# Patient Record
Sex: Female | Born: 1945
Health system: Southern US, Community
[De-identification: ages and names within clinical notes are randomized; demographics above are authoritative.]

## PROBLEM LIST (undated history)

## (undated) DIAGNOSIS — M1990 Unspecified osteoarthritis, unspecified site: Secondary | ICD-10-CM

## (undated) HISTORY — PX: TUBAL LIGATION: SHX77

## (undated) HISTORY — DX: Unspecified osteoarthritis, unspecified site: M19.90

---

## 1998-09-14 ENCOUNTER — Other Ambulatory Visit: Admission: RE | Admit: 1998-09-14 | Discharge: 1998-09-14 | Payer: Self-pay | Admitting: *Deleted

## 1999-01-10 ENCOUNTER — Ambulatory Visit (HOSPITAL_COMMUNITY): Admission: RE | Admit: 1999-01-10 | Discharge: 1999-01-10 | Payer: Self-pay | Admitting: Gastroenterology

## 1999-01-10 ENCOUNTER — Encounter: Payer: Self-pay | Admitting: Gastroenterology

## 1999-09-21 ENCOUNTER — Other Ambulatory Visit: Admission: RE | Admit: 1999-09-21 | Discharge: 1999-09-21 | Payer: Self-pay | Admitting: *Deleted

## 1999-10-11 ENCOUNTER — Encounter: Admission: RE | Admit: 1999-10-11 | Discharge: 1999-10-11 | Payer: Self-pay | Admitting: *Deleted

## 1999-10-11 ENCOUNTER — Encounter: Payer: Self-pay | Admitting: *Deleted

## 2000-10-14 ENCOUNTER — Other Ambulatory Visit: Admission: RE | Admit: 2000-10-14 | Discharge: 2000-10-14 | Payer: Self-pay | Admitting: *Deleted

## 2000-10-15 ENCOUNTER — Encounter: Admission: RE | Admit: 2000-10-15 | Discharge: 2000-10-15 | Payer: Self-pay | Admitting: *Deleted

## 2000-10-15 ENCOUNTER — Encounter: Payer: Self-pay | Admitting: *Deleted

## 2001-10-16 ENCOUNTER — Other Ambulatory Visit: Admission: RE | Admit: 2001-10-16 | Discharge: 2001-10-16 | Payer: Self-pay | Admitting: *Deleted

## 2001-10-22 ENCOUNTER — Encounter: Payer: Self-pay | Admitting: *Deleted

## 2001-10-22 ENCOUNTER — Encounter: Admission: RE | Admit: 2001-10-22 | Discharge: 2001-10-22 | Payer: Self-pay | Admitting: *Deleted

## 2002-11-04 ENCOUNTER — Encounter: Admission: RE | Admit: 2002-11-04 | Discharge: 2002-11-04 | Payer: Self-pay | Admitting: *Deleted

## 2002-11-04 ENCOUNTER — Encounter: Payer: Self-pay | Admitting: *Deleted

## 2002-12-08 ENCOUNTER — Other Ambulatory Visit: Admission: RE | Admit: 2002-12-08 | Discharge: 2002-12-08 | Payer: Self-pay | Admitting: *Deleted

## 2003-12-30 ENCOUNTER — Encounter: Admission: RE | Admit: 2003-12-30 | Discharge: 2003-12-30 | Payer: Self-pay | Admitting: *Deleted

## 2004-01-07 ENCOUNTER — Encounter: Admission: RE | Admit: 2004-01-07 | Discharge: 2004-01-07 | Payer: Self-pay | Admitting: *Deleted

## 2004-02-01 ENCOUNTER — Other Ambulatory Visit: Admission: RE | Admit: 2004-02-01 | Discharge: 2004-02-01 | Payer: Self-pay | Admitting: *Deleted

## 2005-02-27 ENCOUNTER — Encounter: Admission: RE | Admit: 2005-02-27 | Discharge: 2005-02-27 | Payer: Self-pay | Admitting: *Deleted

## 2005-03-19 ENCOUNTER — Other Ambulatory Visit: Admission: RE | Admit: 2005-03-19 | Discharge: 2005-03-19 | Payer: Self-pay | Admitting: Family Medicine

## 2006-05-09 ENCOUNTER — Encounter: Admission: RE | Admit: 2006-05-09 | Discharge: 2006-05-09 | Payer: Self-pay | Admitting: Family Medicine

## 2007-06-17 ENCOUNTER — Encounter: Admission: RE | Admit: 2007-06-17 | Discharge: 2007-06-17 | Payer: Self-pay | Admitting: Family Medicine

## 2007-07-09 ENCOUNTER — Other Ambulatory Visit: Admission: RE | Admit: 2007-07-09 | Discharge: 2007-07-09 | Payer: Self-pay | Admitting: Family Medicine

## 2008-07-13 ENCOUNTER — Encounter: Admission: RE | Admit: 2008-07-13 | Discharge: 2008-07-13 | Payer: Self-pay | Admitting: Family Medicine

## 2009-08-10 ENCOUNTER — Encounter: Admission: RE | Admit: 2009-08-10 | Discharge: 2009-08-10 | Payer: Self-pay | Admitting: Family Medicine

## 2010-01-16 ENCOUNTER — Other Ambulatory Visit: Admission: RE | Admit: 2010-01-16 | Discharge: 2010-01-16 | Payer: Self-pay | Admitting: Family Medicine

## 2010-08-27 ENCOUNTER — Encounter: Payer: Self-pay | Admitting: *Deleted

## 2010-09-05 ENCOUNTER — Encounter
Admission: RE | Admit: 2010-09-05 | Discharge: 2010-09-05 | Payer: Self-pay | Source: Home / Self Care | Attending: Family Medicine | Admitting: Family Medicine

## 2011-06-08 ENCOUNTER — Other Ambulatory Visit: Payer: Self-pay | Admitting: Dermatology

## 2011-09-24 ENCOUNTER — Other Ambulatory Visit: Payer: Self-pay | Admitting: Family Medicine

## 2011-09-24 DIAGNOSIS — Z1231 Encounter for screening mammogram for malignant neoplasm of breast: Secondary | ICD-10-CM

## 2011-10-01 ENCOUNTER — Ambulatory Visit
Admission: RE | Admit: 2011-10-01 | Discharge: 2011-10-01 | Disposition: A | Discharge status: Home / Self Care | Payer: Medicare Other | Source: Ambulatory Visit | Attending: Family Medicine | Admitting: Family Medicine

## 2011-10-01 DIAGNOSIS — Z1231 Encounter for screening mammogram for malignant neoplasm of breast: Secondary | ICD-10-CM

## 2012-04-02 DIAGNOSIS — Z136 Encounter for screening for cardiovascular disorders: Secondary | ICD-10-CM | POA: Diagnosis not present

## 2012-04-02 DIAGNOSIS — Z131 Encounter for screening for diabetes mellitus: Secondary | ICD-10-CM | POA: Diagnosis not present

## 2012-04-02 DIAGNOSIS — Z23 Encounter for immunization: Secondary | ICD-10-CM | POA: Diagnosis not present

## 2012-04-02 DIAGNOSIS — Z0000 Encounter for general adult medical examination without abnormal findings: Secondary | ICD-10-CM | POA: Diagnosis not present

## 2012-04-02 DIAGNOSIS — Z13220 Encounter for screening for lipoid disorders: Secondary | ICD-10-CM | POA: Diagnosis not present

## 2012-04-02 DIAGNOSIS — M899 Disorder of bone, unspecified: Secondary | ICD-10-CM | POA: Diagnosis not present

## 2012-04-10 DIAGNOSIS — H25019 Cortical age-related cataract, unspecified eye: Secondary | ICD-10-CM | POA: Diagnosis not present

## 2012-05-15 DIAGNOSIS — M25569 Pain in unspecified knee: Secondary | ICD-10-CM | POA: Diagnosis not present

## 2012-05-29 DIAGNOSIS — M899 Disorder of bone, unspecified: Secondary | ICD-10-CM | POA: Diagnosis not present

## 2012-05-29 DIAGNOSIS — M949 Disorder of cartilage, unspecified: Secondary | ICD-10-CM | POA: Diagnosis not present

## 2012-07-08 DIAGNOSIS — Z23 Encounter for immunization: Secondary | ICD-10-CM | POA: Diagnosis not present

## 2012-11-13 DIAGNOSIS — M25569 Pain in unspecified knee: Secondary | ICD-10-CM | POA: Diagnosis not present

## 2012-12-15 ENCOUNTER — Other Ambulatory Visit: Payer: Self-pay

## 2012-12-15 DIAGNOSIS — Z1231 Encounter for screening mammogram for malignant neoplasm of breast: Secondary | ICD-10-CM

## 2013-01-20 ENCOUNTER — Ambulatory Visit
Admission: RE | Admit: 2013-01-20 | Discharge: 2013-01-20 | Disposition: A | Discharge status: Home / Self Care | Payer: Medicare Other | Source: Ambulatory Visit

## 2013-01-20 DIAGNOSIS — Z1231 Encounter for screening mammogram for malignant neoplasm of breast: Secondary | ICD-10-CM | POA: Diagnosis not present

## 2013-04-24 DIAGNOSIS — Z8041 Family history of malignant neoplasm of ovary: Secondary | ICD-10-CM | POA: Diagnosis not present

## 2013-04-24 DIAGNOSIS — Z0000 Encounter for general adult medical examination without abnormal findings: Secondary | ICD-10-CM | POA: Diagnosis not present

## 2013-04-24 DIAGNOSIS — Z131 Encounter for screening for diabetes mellitus: Secondary | ICD-10-CM | POA: Diagnosis not present

## 2013-04-24 DIAGNOSIS — M899 Disorder of bone, unspecified: Secondary | ICD-10-CM | POA: Diagnosis not present

## 2013-04-24 DIAGNOSIS — Z803 Family history of malignant neoplasm of breast: Secondary | ICD-10-CM | POA: Diagnosis not present

## 2013-04-24 DIAGNOSIS — M21619 Bunion of unspecified foot: Secondary | ICD-10-CM | POA: Diagnosis not present

## 2013-05-27 DIAGNOSIS — M1710 Unilateral primary osteoarthritis, unspecified knee: Secondary | ICD-10-CM | POA: Diagnosis not present

## 2013-06-04 DIAGNOSIS — Z23 Encounter for immunization: Secondary | ICD-10-CM | POA: Diagnosis not present

## 2013-09-01 ENCOUNTER — Other Ambulatory Visit: Payer: Self-pay | Admitting: Physician Assistant

## 2013-09-01 ENCOUNTER — Ambulatory Visit
Admission: RE | Admit: 2013-09-01 | Discharge: 2013-09-01 | Disposition: A | Discharge status: Home / Self Care | Payer: Medicare Other | Source: Ambulatory Visit | Attending: Physician Assistant | Admitting: Physician Assistant

## 2013-09-01 DIAGNOSIS — R071 Chest pain on breathing: Secondary | ICD-10-CM | POA: Diagnosis not present

## 2013-09-01 DIAGNOSIS — R059 Cough, unspecified: Secondary | ICD-10-CM

## 2013-09-01 DIAGNOSIS — R509 Fever, unspecified: Secondary | ICD-10-CM

## 2013-09-01 DIAGNOSIS — R05 Cough: Secondary | ICD-10-CM

## 2013-09-01 DIAGNOSIS — J069 Acute upper respiratory infection, unspecified: Secondary | ICD-10-CM | POA: Diagnosis not present

## 2013-09-01 DIAGNOSIS — R918 Other nonspecific abnormal finding of lung field: Secondary | ICD-10-CM | POA: Diagnosis not present

## 2013-09-14 ENCOUNTER — Ambulatory Visit
Admission: RE | Admit: 2013-09-14 | Discharge: 2013-09-14 | Disposition: A | Discharge status: Home / Self Care | Payer: Medicare Other | Source: Ambulatory Visit | Attending: Family Medicine | Admitting: Family Medicine

## 2013-09-14 ENCOUNTER — Other Ambulatory Visit: Payer: Self-pay | Admitting: Family Medicine

## 2013-09-14 DIAGNOSIS — R918 Other nonspecific abnormal finding of lung field: Secondary | ICD-10-CM

## 2013-09-14 DIAGNOSIS — R071 Chest pain on breathing: Secondary | ICD-10-CM | POA: Diagnosis not present

## 2013-09-18 DIAGNOSIS — H04129 Dry eye syndrome of unspecified lacrimal gland: Secondary | ICD-10-CM | POA: Diagnosis not present

## 2013-10-08 DIAGNOSIS — Z1211 Encounter for screening for malignant neoplasm of colon: Secondary | ICD-10-CM | POA: Diagnosis not present

## 2013-12-23 ENCOUNTER — Other Ambulatory Visit: Payer: Self-pay

## 2013-12-23 DIAGNOSIS — Z1231 Encounter for screening mammogram for malignant neoplasm of breast: Secondary | ICD-10-CM

## 2014-01-27 ENCOUNTER — Ambulatory Visit
Admission: RE | Admit: 2014-01-27 | Discharge: 2014-01-27 | Disposition: A | Discharge status: Home / Self Care | Payer: Medicare Other | Source: Ambulatory Visit

## 2014-01-27 DIAGNOSIS — Z1231 Encounter for screening mammogram for malignant neoplasm of breast: Secondary | ICD-10-CM

## 2014-04-28 DIAGNOSIS — M949 Disorder of cartilage, unspecified: Secondary | ICD-10-CM | POA: Diagnosis not present

## 2014-04-28 DIAGNOSIS — M899 Disorder of bone, unspecified: Secondary | ICD-10-CM | POA: Diagnosis not present

## 2014-04-28 DIAGNOSIS — Z01419 Encounter for gynecological examination (general) (routine) without abnormal findings: Secondary | ICD-10-CM | POA: Diagnosis not present

## 2014-04-28 DIAGNOSIS — Z8041 Family history of malignant neoplasm of ovary: Secondary | ICD-10-CM | POA: Diagnosis not present

## 2014-04-28 DIAGNOSIS — Z23 Encounter for immunization: Secondary | ICD-10-CM | POA: Diagnosis not present

## 2014-04-28 DIAGNOSIS — Z0000 Encounter for general adult medical examination without abnormal findings: Secondary | ICD-10-CM | POA: Diagnosis not present

## 2014-04-28 DIAGNOSIS — Z131 Encounter for screening for diabetes mellitus: Secondary | ICD-10-CM | POA: Diagnosis not present

## 2014-04-28 DIAGNOSIS — Z803 Family history of malignant neoplasm of breast: Secondary | ICD-10-CM | POA: Diagnosis not present

## 2014-06-01 DIAGNOSIS — M810 Age-related osteoporosis without current pathological fracture: Secondary | ICD-10-CM | POA: Diagnosis not present

## 2014-08-16 ENCOUNTER — Other Ambulatory Visit: Payer: Self-pay | Admitting: Dermatology

## 2014-08-16 DIAGNOSIS — L812 Freckles: Secondary | ICD-10-CM | POA: Diagnosis not present

## 2014-08-16 DIAGNOSIS — L821 Other seborrheic keratosis: Secondary | ICD-10-CM | POA: Diagnosis not present

## 2014-08-16 DIAGNOSIS — L820 Inflamed seborrheic keratosis: Secondary | ICD-10-CM | POA: Diagnosis not present

## 2014-08-16 DIAGNOSIS — C44612 Basal cell carcinoma of skin of right upper limb, including shoulder: Secondary | ICD-10-CM | POA: Diagnosis not present

## 2014-08-16 DIAGNOSIS — L700 Acne vulgaris: Secondary | ICD-10-CM | POA: Diagnosis not present

## 2014-08-16 DIAGNOSIS — D2271 Melanocytic nevi of right lower limb, including hip: Secondary | ICD-10-CM | POA: Diagnosis not present

## 2014-08-16 DIAGNOSIS — D1801 Hemangioma of skin and subcutaneous tissue: Secondary | ICD-10-CM | POA: Diagnosis not present

## 2014-08-16 DIAGNOSIS — D485 Neoplasm of uncertain behavior of skin: Secondary | ICD-10-CM | POA: Diagnosis not present

## 2014-08-18 ENCOUNTER — Encounter: Payer: Self-pay | Admitting: Sports Medicine

## 2014-08-18 ENCOUNTER — Ambulatory Visit (INDEPENDENT_AMBULATORY_CARE_PROVIDER_SITE_OTHER): Payer: Medicare Other | Admitting: Sports Medicine

## 2014-08-18 VITALS — BP 111/74 | HR 75 | Ht 62.0 in | Wt 110.0 lb

## 2014-08-18 DIAGNOSIS — M2010 Hallux valgus (acquired), unspecified foot: Secondary | ICD-10-CM | POA: Insufficient documentation

## 2014-08-18 DIAGNOSIS — M25579 Pain in unspecified ankle and joints of unspecified foot: Secondary | ICD-10-CM | POA: Insufficient documentation

## 2014-08-18 DIAGNOSIS — M25571 Pain in right ankle and joints of right foot: Secondary | ICD-10-CM | POA: Diagnosis not present

## 2014-08-18 DIAGNOSIS — M21619 Bunion of unspecified foot: Secondary | ICD-10-CM

## 2014-08-18 NOTE — Progress Notes (Signed)
   Subjective:    Patient ID: Kristina Myers, female    DOB: [birth year], 69 y.o.   MRN: [id number]  HPI [name] is a 69 yo F who presents with right foot pain. The pain is described as being achy in nature and located in the forefoot on the plantar aspect of the 2nd metatarsal. Approximately 2 years ago she reports dropping a wake board onto her foot, and after that time noticed a deformity of her second toe that progressively worsened and has become painful. She also has bilateral bunions that she has had since her mid-20's, but reports little to no pain associated with them. The pain is worsened after she plays tennis or if she walks longer distances. She has tried NSAIDs without noticeable improvement. She has also tried OTC gel inserts and reverse heel shoes, both of which have provided minimal improvement. She denies any associated swelling, discoloration, numbness or tingling.   Mother and GM had bunions  Review of Systems Negative other than noted in HPI.     Objective:   Physical Exam Vitals: BP 111/74 P 75 General: well-appearing, pleasant female in no acute distress  Right Foot/Ankle:  Inspection: Collapse of the transverse and longitudinal arch; prominent bunion with significant shortening of great toe; hammer toe involving the second PIP joint Valgus deviation of Great toe is ~ 50 deg Medial subluxation MTP 1 Palpation: Tenderness along the MTP joint of the second toe; no tenderness to palpation over the midfoot or over the malleoli ROM: full, painless ROM with flexion, extension, inversion and eversion; full flexion and extension of all toes Strength: 5/5 with flexion, extension, inversion, eversion of ankle and toes Special Testing: negative anterior drawer and talar tilt testing Neurovascular: neurovascularly intact distally  Left Foot/Ankle:  Inspection: mild collapse of transverse arch; prominent bunion with significant shortening of great toe Valgus deviation  is ~ 30 deg Medial subluxation MTP 1 Palpation: no tenderness to palpation over forefoot, midfoot or over the malleoli ROM: full, painless ROM with flexion, extension, inversion and eversion; full flexion and extension of all toes Strength: 5/5 with flexion, extension, inversion, eversion of ankle and toes Special Testing: negative anterior drawer and talar tilt testing Neurovascular: neurovascularly intact distally       Assessment & Plan:   1. Right 2nd Toe Pain:  Given the pain is predominantly located over the MTP joint of the right second toe, without radiation and the presence of a hammer toe deformity involving the PIP joint in the setting of a prominent bunion with metatarsal shortening and collapsed transverse arch, this pain is likely mechanical in nature.   - Provided pt with green sports insert with MT pad to support metatarsals and bunion pad - Provided pt with hammer toe strap to prevent discomfort while wearing shoes - Will follow-up in 1 mos. If significant improvement is seen with the current insert, may consider benefit of continued insert use vs custom orthotic fitting.    This patient was seen with and note dictated by Crissie Sickles, MS4 Agree with note/ edited   -- Ila Mcgill, MD

## 2015-01-27 DIAGNOSIS — H52223 Regular astigmatism, bilateral: Secondary | ICD-10-CM | POA: Diagnosis not present

## 2015-01-27 DIAGNOSIS — H5213 Myopia, bilateral: Secondary | ICD-10-CM | POA: Diagnosis not present

## 2015-01-27 DIAGNOSIS — H25813 Combined forms of age-related cataract, bilateral: Secondary | ICD-10-CM | POA: Diagnosis not present

## 2015-02-22 DIAGNOSIS — Z85828 Personal history of other malignant neoplasm of skin: Secondary | ICD-10-CM | POA: Diagnosis not present

## 2015-03-31 ENCOUNTER — Other Ambulatory Visit: Payer: Self-pay

## 2015-03-31 DIAGNOSIS — Z1231 Encounter for screening mammogram for malignant neoplasm of breast: Secondary | ICD-10-CM

## 2015-04-13 ENCOUNTER — Ambulatory Visit: Payer: Medicare Other

## 2015-04-14 ENCOUNTER — Ambulatory Visit
Admission: RE | Admit: 2015-04-14 | Discharge: 2015-04-14 | Disposition: A | Discharge status: Home / Self Care | Payer: Medicare Other | Source: Ambulatory Visit

## 2015-04-14 DIAGNOSIS — Z1231 Encounter for screening mammogram for malignant neoplasm of breast: Secondary | ICD-10-CM | POA: Diagnosis not present

## 2015-06-02 DIAGNOSIS — Z23 Encounter for immunization: Secondary | ICD-10-CM | POA: Diagnosis not present

## 2015-08-24 DIAGNOSIS — Z1389 Encounter for screening for other disorder: Secondary | ICD-10-CM | POA: Diagnosis not present

## 2015-08-24 DIAGNOSIS — Z8041 Family history of malignant neoplasm of ovary: Secondary | ICD-10-CM | POA: Diagnosis not present

## 2015-08-24 DIAGNOSIS — Z803 Family history of malignant neoplasm of breast: Secondary | ICD-10-CM | POA: Diagnosis not present

## 2015-08-24 DIAGNOSIS — Z01419 Encounter for gynecological examination (general) (routine) without abnormal findings: Secondary | ICD-10-CM | POA: Diagnosis not present

## 2015-08-24 DIAGNOSIS — Z0000 Encounter for general adult medical examination without abnormal findings: Secondary | ICD-10-CM | POA: Diagnosis not present

## 2015-08-24 DIAGNOSIS — M810 Age-related osteoporosis without current pathological fracture: Secondary | ICD-10-CM | POA: Diagnosis not present

## 2015-08-24 DIAGNOSIS — Z23 Encounter for immunization: Secondary | ICD-10-CM | POA: Diagnosis not present

## 2015-09-06 DIAGNOSIS — L821 Other seborrheic keratosis: Secondary | ICD-10-CM | POA: Diagnosis not present

## 2015-09-06 DIAGNOSIS — L812 Freckles: Secondary | ICD-10-CM | POA: Diagnosis not present

## 2015-09-06 DIAGNOSIS — Z85828 Personal history of other malignant neoplasm of skin: Secondary | ICD-10-CM | POA: Diagnosis not present

## 2015-09-06 DIAGNOSIS — D044 Carcinoma in situ of skin of scalp and neck: Secondary | ICD-10-CM | POA: Diagnosis not present

## 2015-09-06 DIAGNOSIS — D485 Neoplasm of uncertain behavior of skin: Secondary | ICD-10-CM | POA: Diagnosis not present

## 2015-09-06 DIAGNOSIS — D1801 Hemangioma of skin and subcutaneous tissue: Secondary | ICD-10-CM | POA: Diagnosis not present

## 2015-09-06 DIAGNOSIS — L570 Actinic keratosis: Secondary | ICD-10-CM | POA: Diagnosis not present

## 2016-04-03 ENCOUNTER — Other Ambulatory Visit: Payer: Self-pay | Admitting: Family Medicine

## 2016-04-03 DIAGNOSIS — L821 Other seborrheic keratosis: Secondary | ICD-10-CM | POA: Diagnosis not present

## 2016-04-03 DIAGNOSIS — Z1231 Encounter for screening mammogram for malignant neoplasm of breast: Secondary | ICD-10-CM

## 2016-04-03 DIAGNOSIS — Z85828 Personal history of other malignant neoplasm of skin: Secondary | ICD-10-CM | POA: Diagnosis not present

## 2016-04-18 ENCOUNTER — Ambulatory Visit
Admission: RE | Admit: 2016-04-18 | Discharge: 2016-04-18 | Disposition: A | Discharge status: Home / Self Care | Payer: Medicare Other | Source: Ambulatory Visit | Attending: Family Medicine | Admitting: Family Medicine

## 2016-04-18 DIAGNOSIS — Z1231 Encounter for screening mammogram for malignant neoplasm of breast: Secondary | ICD-10-CM

## 2016-05-16 DIAGNOSIS — H5213 Myopia, bilateral: Secondary | ICD-10-CM | POA: Diagnosis not present

## 2016-05-16 DIAGNOSIS — H25813 Combined forms of age-related cataract, bilateral: Secondary | ICD-10-CM | POA: Diagnosis not present

## 2016-05-16 DIAGNOSIS — H52223 Regular astigmatism, bilateral: Secondary | ICD-10-CM | POA: Diagnosis not present

## 2016-05-30 DIAGNOSIS — Z23 Encounter for immunization: Secondary | ICD-10-CM | POA: Diagnosis not present

## 2016-11-07 DIAGNOSIS — Z803 Family history of malignant neoplasm of breast: Secondary | ICD-10-CM | POA: Diagnosis not present

## 2016-11-07 DIAGNOSIS — Z136 Encounter for screening for cardiovascular disorders: Secondary | ICD-10-CM | POA: Diagnosis not present

## 2016-11-07 DIAGNOSIS — Z01419 Encounter for gynecological examination (general) (routine) without abnormal findings: Secondary | ICD-10-CM | POA: Diagnosis not present

## 2016-11-07 DIAGNOSIS — C4490 Unspecified malignant neoplasm of skin, unspecified: Secondary | ICD-10-CM | POA: Diagnosis not present

## 2016-11-07 DIAGNOSIS — Z8041 Family history of malignant neoplasm of ovary: Secondary | ICD-10-CM | POA: Diagnosis not present

## 2016-11-07 DIAGNOSIS — M810 Age-related osteoporosis without current pathological fracture: Secondary | ICD-10-CM | POA: Diagnosis not present

## 2016-11-07 DIAGNOSIS — Z131 Encounter for screening for diabetes mellitus: Secondary | ICD-10-CM | POA: Diagnosis not present

## 2016-11-07 DIAGNOSIS — Z0000 Encounter for general adult medical examination without abnormal findings: Secondary | ICD-10-CM | POA: Diagnosis not present

## 2016-11-07 DIAGNOSIS — Z1389 Encounter for screening for other disorder: Secondary | ICD-10-CM | POA: Diagnosis not present

## 2016-12-07 DIAGNOSIS — Z85828 Personal history of other malignant neoplasm of skin: Secondary | ICD-10-CM | POA: Diagnosis not present

## 2016-12-07 DIAGNOSIS — L821 Other seborrheic keratosis: Secondary | ICD-10-CM | POA: Diagnosis not present

## 2016-12-07 DIAGNOSIS — L812 Freckles: Secondary | ICD-10-CM | POA: Diagnosis not present

## 2016-12-07 DIAGNOSIS — D1801 Hemangioma of skin and subcutaneous tissue: Secondary | ICD-10-CM | POA: Diagnosis not present

## 2016-12-12 DIAGNOSIS — M810 Age-related osteoporosis without current pathological fracture: Secondary | ICD-10-CM | POA: Diagnosis not present

## 2016-12-12 DIAGNOSIS — M8588 Other specified disorders of bone density and structure, other site: Secondary | ICD-10-CM | POA: Diagnosis not present

## 2017-01-09 DIAGNOSIS — H5213 Myopia, bilateral: Secondary | ICD-10-CM | POA: Diagnosis not present

## 2017-01-09 DIAGNOSIS — H52223 Regular astigmatism, bilateral: Secondary | ICD-10-CM | POA: Diagnosis not present

## 2017-01-09 DIAGNOSIS — H25813 Combined forms of age-related cataract, bilateral: Secondary | ICD-10-CM | POA: Diagnosis not present

## 2017-05-20 ENCOUNTER — Other Ambulatory Visit: Payer: Self-pay | Admitting: Family Medicine

## 2017-05-20 DIAGNOSIS — Z1231 Encounter for screening mammogram for malignant neoplasm of breast: Secondary | ICD-10-CM

## 2017-05-29 ENCOUNTER — Ambulatory Visit (INDEPENDENT_AMBULATORY_CARE_PROVIDER_SITE_OTHER): Payer: Medicare Other | Admitting: Physician Assistant

## 2017-05-29 ENCOUNTER — Encounter: Payer: Self-pay | Admitting: Physician Assistant

## 2017-05-29 VITALS — BP 120/70 | HR 75 | Temp 98.4°F | Resp 16 | Ht 61.0 in | Wt 108.4 lb

## 2017-05-29 DIAGNOSIS — H6123 Impacted cerumen, bilateral: Secondary | ICD-10-CM

## 2017-05-29 NOTE — Progress Notes (Signed)
   Kristina Myers  MRN: [id number] DOB: 20-Nov-1945  PCP: Kelton Pillar, MD  Subjective:  Pt is a 71 year old female who presents to clinic for ear lavage. [facility] when they suggested she have her ears cleaned out.    Review of Systems  HENT: Negative for ear discharge, ear pain, hearing loss and tinnitus.   Neurological: Negative for dizziness.    Patient Active Problem List   Diagnosis Date Noted  . Pain in joint, ankle and foot 08/18/2014  . Hallux valgus with bunions 08/18/2014    Current Outpatient Prescriptions on File Prior to Visit  Medication Sig Dispense Refill  . alendronate (FOSAMAX) 70 MG tablet   4  . raloxifene (EVISTA) 60 MG tablet      No current facility-administered medications on file prior to visit.     Allergies  Allergen Reactions  . Penicillins      Objective:  BP 120/70   Pulse 75   Temp 98.4 F (36.9 C) (Oral)   Resp 16   Ht 5\' 1"  (1.549 m)   Wt 108 lb 6.4 oz (49.2 kg)   SpO2 98%   BMI 20.48 kg/m   Physical Exam  Constitutional: She is oriented to person, place, and time and well-developed, well-nourished, and in no distress. No distress.  HENT:  B/l cerumen impaction  Neurological: She is alert and oriented to person, place, and time. GCS score is 15.  Skin: Skin is warm and dry.  Psychiatric: Mood, memory, affect and judgment normal.  Vitals reviewed.   Assessment and Plan :  1. Bilateral impacted cerumen - Ear wax removal - Ear lavage performed without difficulty. Advised d/c Q-tips. RTC PRN for routine cleaning.   Mercer Pod, PA-C  [facility] 05/29/2017 3:50 PM

## 2017-05-29 NOTE — Patient Instructions (Addendum)
  Please do not use Q-tips, as this will further impact the ear wax in your ear.   If you have cerumen impaction more than once a year you can reduce the occurrences by using a cotton ball dipped in mineral oil and place it in the external canal for 10-20 minutes once a week (combined with eight hours of not using a hearing aid overnight, if applicable). This helps liquify cerumen and aid in the normal elimination mechanisms.    Routine cleaning of ears by a health professional every 6-12 months is recommended.   If you have itchy ears, use sweet oil. If you need more relief use a small amount of hydrocortisone ointment.   Thank you for coming in today. I hope you feel we met your needs.  Feel free to call PCP if you have any questions or further requests.  Please consider signing up for MyChart if you do not already have it, as this is a great way to communicate with me.  Best,  Whitney McVey, PA-C   IF you received an x-ray today, you will receive an invoice from [facility]. Please contact [facility] at [phone] with questions or concerns regarding your invoice.   IF you received labwork today, you will receive an invoice from [facility]. Please contact [facility] at [phone] with questions or concerns regarding your invoice.   Our billing staff will not be able to assist you with questions regarding bills from these companies.  You will be contacted with the lab results as soon as they are available. The fastest way to get your results is to activate your My Chart account. Instructions are located on the last page of this paperwork. If you have not heard from [place] regarding the results in 2 weeks, please contact this office.

## 2017-05-30 DIAGNOSIS — H2511 Age-related nuclear cataract, right eye: Secondary | ICD-10-CM | POA: Diagnosis not present

## 2017-05-30 DIAGNOSIS — H10413 Chronic giant papillary conjunctivitis, bilateral: Secondary | ICD-10-CM | POA: Diagnosis not present

## 2017-05-30 DIAGNOSIS — Z23 Encounter for immunization: Secondary | ICD-10-CM | POA: Diagnosis not present

## 2017-05-30 DIAGNOSIS — H25812 Combined forms of age-related cataract, left eye: Secondary | ICD-10-CM | POA: Diagnosis not present

## 2017-06-05 ENCOUNTER — Ambulatory Visit
Admission: RE | Admit: 2017-06-05 | Discharge: 2017-06-05 | Disposition: A | Discharge status: Home / Self Care | Payer: Medicare Other | Source: Ambulatory Visit | Attending: Family Medicine | Admitting: Family Medicine

## 2017-06-05 DIAGNOSIS — Z1231 Encounter for screening mammogram for malignant neoplasm of breast: Secondary | ICD-10-CM

## 2017-10-31 DIAGNOSIS — H2511 Age-related nuclear cataract, right eye: Secondary | ICD-10-CM | POA: Diagnosis not present

## 2017-10-31 DIAGNOSIS — H25812 Combined forms of age-related cataract, left eye: Secondary | ICD-10-CM | POA: Diagnosis not present

## 2017-10-31 DIAGNOSIS — H2512 Age-related nuclear cataract, left eye: Secondary | ICD-10-CM | POA: Diagnosis not present

## 2017-11-07 ENCOUNTER — Other Ambulatory Visit: Payer: Self-pay

## 2017-11-07 ENCOUNTER — Encounter (HOSPITAL_COMMUNITY): Payer: Self-pay | Admitting: Emergency Medicine

## 2017-11-07 ENCOUNTER — Emergency Department (HOSPITAL_COMMUNITY)
Admission: EM | Admit: 2017-11-07 | Discharge: 2017-11-07 | Disposition: A | Discharge status: Home / Self Care | Payer: Medicare Other | Attending: Emergency Medicine | Admitting: Emergency Medicine

## 2017-11-07 DIAGNOSIS — Y999 Unspecified external cause status: Secondary | ICD-10-CM | POA: Insufficient documentation

## 2017-11-07 DIAGNOSIS — R42 Dizziness and giddiness: Secondary | ICD-10-CM | POA: Diagnosis not present

## 2017-11-07 DIAGNOSIS — S0181XA Laceration without foreign body of other part of head, initial encounter: Secondary | ICD-10-CM | POA: Insufficient documentation

## 2017-11-07 DIAGNOSIS — W01198A Fall on same level from slipping, tripping and stumbling with subsequent striking against other object, initial encounter: Secondary | ICD-10-CM | POA: Diagnosis not present

## 2017-11-07 DIAGNOSIS — R55 Syncope and collapse: Secondary | ICD-10-CM | POA: Diagnosis not present

## 2017-11-07 DIAGNOSIS — Y939 Activity, unspecified: Secondary | ICD-10-CM | POA: Diagnosis not present

## 2017-11-07 DIAGNOSIS — Y92238 Other place in hospital as the place of occurrence of the external cause: Secondary | ICD-10-CM | POA: Diagnosis not present

## 2017-11-07 MED ORDER — BACITRACIN ZINC 500 UNIT/GM EX OINT
TOPICAL_OINTMENT | Freq: Once | CUTANEOUS | Status: AC
  Administered 2017-11-07: 1 via TOPICAL

## 2017-11-07 MED ORDER — LIDOCAINE-EPINEPHRINE (PF) 2 %-1:200000 IJ SOLN: 10.0000 mL | Freq: Once | INTRAMUSCULAR | Status: DC

## 2017-11-07 NOTE — ED Triage Notes (Signed)
Pt visiting with husband in triage. Pt became lightheaded when seeing husband's wound bleeding and passed out. Pt hit her chin on the floor and urinated on herself. Pt helped up by staff, unable to bear weight at the time.

## 2017-11-07 NOTE — ED Provider Notes (Signed)
[facility] DEPT Provider Note   CSN: [id number] Arrival date & time: 11/07/17  0144     History   Chief Complaint Chief Complaint  Patient presents with  . Loss of Consciousness    HPI Kristina Myers is a 72 y.o. female.  The history is provided by the patient.  She was in the emergency department with her husband because of some bleeding from the surgical site on his chest.  While it was being evaluated, the patient became lightheaded and had a syncopal episode.  She did hit her chin on the floor and suffered a laceration.  There is no nausea, but she was diaphoretic.  She feels as if she is back to baseline.  She is up-to-date on tetanus immunizations.  Past Medical History:  Diagnosis Date  . Arthritis     Patient Active Problem List   Diagnosis Date Noted  . Pain in joint, ankle and foot 08/18/2014  . Hallux valgus with bunions 08/18/2014    Past Surgical History:  Procedure Laterality Date  . CESAREAN SECTION    . TUBAL LIGATION       OB History   None      Home Medications    Prior to Admission medications   Medication Sig Start Date End Date Taking? Authorizing Provider  alendronate (FOSAMAX) 70 MG tablet  07/08/14   [provider]  raloxifene (EVISTA) 60 MG tablet  06/24/09   [provider]    Family History Family History  Problem Relation Age of Onset  . Cancer Mother        Breast  . Breast cancer Mother   . Mental illness Father   . Mental illness Paternal Grandmother     Social History Social History   Tobacco Use  . Smoking status: Never Smoker  . Smokeless tobacco: Never Used  Substance Use Topics  . Alcohol use: Yes    Alcohol/week: 3.6 oz    Types: 6 Standard drinks or equivalent per week  . Drug use: No     Allergies   Penicillins   Review of Systems Review of Systems  All other systems reviewed and are negative.    Physical Exam Updated Vital Signs BP 121/71  (BP Location: Right Arm)   Pulse 68   Temp 97.8 F (36.6 C) (Oral)   Resp 18   Ht 5\' 2"  (1.575 m)   Wt 49.4 kg (109 lb)   SpO2 100%   BMI 19.94 kg/m   Physical Exam  Nursing note and vitals reviewed.  72 year old female, resting comfortably and in no acute distress. Vital signs are normal. Oxygen saturation is 100%, which is normal. Head is normocephalic.  Laceration present on the chin with slight bleeding. PERRLA, EOMI. Oropharynx is clear. Neck is nontender and supple without adenopathy or JVD. Back is nontender and there is no CVA tenderness. Lungs are clear without rales, wheezes, or rhonchi. Chest is nontender. Heart has regular rate and rhythm without murmur. Abdomen is soft, flat, nontender without masses or hepatosplenomegaly and peristalsis is normoactive. Extremities have no cyanosis or edema, full range of motion is present. Skin is warm and dry without rash. Neurologic: Mental status is normal, cranial nerves are intact, there are no motor or sensory deficits.  ED Treatments / Results   EKG EKG Interpretation  Date/Time:  Thursday November 07 2017 01:50:31 EDT Ventricular Rate:  60 PR Interval:    QRS Duration: 93 QT Interval:  475  QTC Calculation: 475 R Axis:   41 Text Interpretation:  Sinus rhythm Normal ECG No old tracing to compare Confirmed by Delora Fuel ([id number]) on 11/07/2017 2:09:22 AM  Procedures .Marland KitchenLaceration Repair Date/Time: 11/07/2017 3:14 AM Performed by: Delora Fuel, MD Authorized by: Delora Fuel, MD   Consent:    Consent obtained:  Verbal   Consent given by:  Patient   Risks discussed:  Infection and pain   Alternatives discussed:  Delayed treatment Anesthesia (see MAR for exact dosages):    Anesthesia method:  Local infiltration   Local anesthetic:  Lidocaine 2% WITH epi Laceration details:    Location:  Face   Face location:  Chin   Length (cm):  1.5   Depth (mm):  3 Repair type:    Repair type:  Simple Pre-procedure details:     Preparation:  Patient was prepped and draped in usual sterile fashion Exploration:    Hemostasis achieved with:  Direct pressure   Wound exploration: entire depth of wound probed and visualized     Wound extent: no foreign bodies/material noted   Treatment:    Area cleansed with:  Shur-Clens   Amount of cleaning:  Standard Skin repair:    Repair method:  Sutures   Suture size:  5-0   Suture material:  Prolene   Suture technique:  Simple interrupted   Number of sutures:  3 Approximation:    Approximation:  Close Post-procedure details:    Dressing:  Antibiotic ointment   Patient tolerance of procedure:  Tolerated well, no immediate complications     Medications Ordered in ED Medications  lidocaine-EPINEPHrine (XYLOCAINE W/EPI) 2 %-1:200000 (PF) injection 10 mL (has no administration in time range)     Initial Impression / Assessment and Plan / ED Course  I have reviewed the triage vital signs and the nursing notes.  Vasovagal syncope.  Laceration of chin which is closed with sutures.  Final Clinical Impressions(s) / ED Diagnoses   Final diagnoses:  Vasovagal syncope  Laceration of chin, initial encounter    ED Discharge Orders    None       Delora Fuel, MD 91/50/56 [phone]

## 2017-11-15 DIAGNOSIS — Z4802 Encounter for removal of sutures: Secondary | ICD-10-CM | POA: Diagnosis not present

## 2017-11-15 DIAGNOSIS — S0181XA Laceration without foreign body of other part of head, initial encounter: Secondary | ICD-10-CM | POA: Diagnosis not present

## 2017-11-18 DIAGNOSIS — H2512 Age-related nuclear cataract, left eye: Secondary | ICD-10-CM | POA: Diagnosis not present

## 2017-12-09 DIAGNOSIS — L821 Other seborrheic keratosis: Secondary | ICD-10-CM | POA: Diagnosis not present

## 2017-12-09 DIAGNOSIS — L570 Actinic keratosis: Secondary | ICD-10-CM | POA: Diagnosis not present

## 2017-12-09 DIAGNOSIS — D1801 Hemangioma of skin and subcutaneous tissue: Secondary | ICD-10-CM | POA: Diagnosis not present

## 2017-12-09 DIAGNOSIS — L812 Freckles: Secondary | ICD-10-CM | POA: Diagnosis not present

## 2018-05-02 ENCOUNTER — Other Ambulatory Visit: Payer: Self-pay | Admitting: Family Medicine

## 2018-05-02 DIAGNOSIS — Z1231 Encounter for screening mammogram for malignant neoplasm of breast: Secondary | ICD-10-CM

## 2018-05-07 DIAGNOSIS — Z1389 Encounter for screening for other disorder: Secondary | ICD-10-CM | POA: Diagnosis not present

## 2018-05-07 DIAGNOSIS — Z0000 Encounter for general adult medical examination without abnormal findings: Secondary | ICD-10-CM | POA: Diagnosis not present

## 2018-05-07 DIAGNOSIS — E559 Vitamin D deficiency, unspecified: Secondary | ICD-10-CM | POA: Diagnosis not present

## 2018-05-07 DIAGNOSIS — M810 Age-related osteoporosis without current pathological fracture: Secondary | ICD-10-CM | POA: Diagnosis not present

## 2018-05-07 DIAGNOSIS — Z1159 Encounter for screening for other viral diseases: Secondary | ICD-10-CM | POA: Diagnosis not present

## 2018-05-27 DIAGNOSIS — Z23 Encounter for immunization: Secondary | ICD-10-CM | POA: Diagnosis not present

## 2018-09-28 DIAGNOSIS — S63502A Unspecified sprain of left wrist, initial encounter: Secondary | ICD-10-CM | POA: Diagnosis not present

## 2018-09-28 DIAGNOSIS — M25532 Pain in left wrist: Secondary | ICD-10-CM | POA: Diagnosis not present

## 2018-09-28 DIAGNOSIS — Z7983 Long term (current) use of bisphosphonates: Secondary | ICD-10-CM | POA: Diagnosis not present

## 2018-09-28 DIAGNOSIS — S6982XA Other specified injuries of left wrist, hand and finger(s), initial encounter: Secondary | ICD-10-CM | POA: Diagnosis not present

## 2018-09-28 DIAGNOSIS — M1812 Unilateral primary osteoarthritis of first carpometacarpal joint, left hand: Secondary | ICD-10-CM | POA: Diagnosis not present

## 2018-10-06 ENCOUNTER — Other Ambulatory Visit: Payer: Self-pay | Admitting: Family Medicine

## 2018-10-06 DIAGNOSIS — Z1231 Encounter for screening mammogram for malignant neoplasm of breast: Secondary | ICD-10-CM

## 2018-10-29 ENCOUNTER — Ambulatory Visit: Payer: Medicare Other

## 2018-12-30 ENCOUNTER — Other Ambulatory Visit: Payer: Self-pay

## 2018-12-30 ENCOUNTER — Ambulatory Visit
Admission: RE | Admit: 2018-12-30 | Discharge: 2018-12-30 | Disposition: A | Discharge status: Home / Self Care | Payer: Medicare Other | Source: Ambulatory Visit | Attending: Family Medicine | Admitting: Family Medicine

## 2018-12-30 DIAGNOSIS — Z1231 Encounter for screening mammogram for malignant neoplasm of breast: Secondary | ICD-10-CM

## 2019-01-08 DIAGNOSIS — L738 Other specified follicular disorders: Secondary | ICD-10-CM | POA: Diagnosis not present

## 2019-01-08 DIAGNOSIS — L570 Actinic keratosis: Secondary | ICD-10-CM | POA: Diagnosis not present

## 2019-01-08 DIAGNOSIS — L812 Freckles: Secondary | ICD-10-CM | POA: Diagnosis not present

## 2019-01-08 DIAGNOSIS — L821 Other seborrheic keratosis: Secondary | ICD-10-CM | POA: Diagnosis not present

## 2019-01-08 DIAGNOSIS — D225 Melanocytic nevi of trunk: Secondary | ICD-10-CM | POA: Diagnosis not present

## 2019-04-27 DIAGNOSIS — Z961 Presence of intraocular lens: Secondary | ICD-10-CM | POA: Diagnosis not present

## 2019-04-27 DIAGNOSIS — H2511 Age-related nuclear cataract, right eye: Secondary | ICD-10-CM | POA: Diagnosis not present

## 2019-04-27 DIAGNOSIS — H26492 Other secondary cataract, left eye: Secondary | ICD-10-CM | POA: Diagnosis not present

## 2019-05-25 DIAGNOSIS — Z1389 Encounter for screening for other disorder: Secondary | ICD-10-CM | POA: Diagnosis not present

## 2019-05-25 DIAGNOSIS — Z131 Encounter for screening for diabetes mellitus: Secondary | ICD-10-CM | POA: Diagnosis not present

## 2019-05-25 DIAGNOSIS — Z8041 Family history of malignant neoplasm of ovary: Secondary | ICD-10-CM | POA: Diagnosis not present

## 2019-05-25 DIAGNOSIS — C4490 Unspecified malignant neoplasm of skin, unspecified: Secondary | ICD-10-CM | POA: Diagnosis not present

## 2019-05-25 DIAGNOSIS — Z803 Family history of malignant neoplasm of breast: Secondary | ICD-10-CM | POA: Diagnosis not present

## 2019-05-25 DIAGNOSIS — M810 Age-related osteoporosis without current pathological fracture: Secondary | ICD-10-CM | POA: Diagnosis not present

## 2019-05-25 DIAGNOSIS — Z23 Encounter for immunization: Secondary | ICD-10-CM | POA: Diagnosis not present

## 2019-05-25 DIAGNOSIS — Z0000 Encounter for general adult medical examination without abnormal findings: Secondary | ICD-10-CM | POA: Diagnosis not present

## 2019-05-29 DIAGNOSIS — Z1159 Encounter for screening for other viral diseases: Secondary | ICD-10-CM | POA: Diagnosis not present

## 2019-05-29 DIAGNOSIS — Z20828 Contact with and (suspected) exposure to other viral communicable diseases: Secondary | ICD-10-CM | POA: Diagnosis not present

## 2019-10-01 DIAGNOSIS — M8580 Other specified disorders of bone density and structure, unspecified site: Secondary | ICD-10-CM | POA: Diagnosis not present

## 2019-10-01 DIAGNOSIS — B029 Zoster without complications: Secondary | ICD-10-CM | POA: Diagnosis not present

## 2019-11-27 ENCOUNTER — Other Ambulatory Visit: Payer: Self-pay | Admitting: Family Medicine

## 2019-11-27 DIAGNOSIS — Z1231 Encounter for screening mammogram for malignant neoplasm of breast: Secondary | ICD-10-CM

## 2020-01-11 ENCOUNTER — Emergency Department (HOSPITAL_COMMUNITY): Payer: Medicare Other

## 2020-01-11 ENCOUNTER — Inpatient Hospital Stay (HOSPITAL_COMMUNITY): Payer: Medicare Other

## 2020-01-11 ENCOUNTER — Other Ambulatory Visit: Payer: Self-pay

## 2020-01-11 ENCOUNTER — Encounter (HOSPITAL_COMMUNITY): Payer: Self-pay | Admitting: Emergency Medicine

## 2020-01-11 ENCOUNTER — Inpatient Hospital Stay (HOSPITAL_COMMUNITY)
Admission: EM | Admit: 2020-01-11 | Discharge: 2020-01-18 | DRG: 871 | Disposition: A | Discharge status: Home / Self Care | Payer: Medicare Other | Attending: Family Medicine | Admitting: Family Medicine

## 2020-01-11 DIAGNOSIS — Z79899 Other long term (current) drug therapy: Secondary | ICD-10-CM

## 2020-01-11 DIAGNOSIS — R000 Tachycardia, unspecified: Secondary | ICD-10-CM | POA: Diagnosis not present

## 2020-01-11 DIAGNOSIS — I471 Supraventricular tachycardia: Secondary | ICD-10-CM | POA: Diagnosis not present

## 2020-01-11 DIAGNOSIS — Z20822 Contact with and (suspected) exposure to covid-19: Secondary | ICD-10-CM | POA: Diagnosis present

## 2020-01-11 DIAGNOSIS — J939 Pneumothorax, unspecified: Secondary | ICD-10-CM | POA: Diagnosis not present

## 2020-01-11 DIAGNOSIS — D72819 Decreased white blood cell count, unspecified: Secondary | ICD-10-CM | POA: Diagnosis present

## 2020-01-11 DIAGNOSIS — I248 Other forms of acute ischemic heart disease: Secondary | ICD-10-CM | POA: Diagnosis present

## 2020-01-11 DIAGNOSIS — R778 Other specified abnormalities of plasma proteins: Secondary | ICD-10-CM | POA: Diagnosis not present

## 2020-01-11 DIAGNOSIS — I517 Cardiomegaly: Secondary | ICD-10-CM | POA: Diagnosis not present

## 2020-01-11 DIAGNOSIS — R6520 Severe sepsis without septic shock: Secondary | ICD-10-CM | POA: Diagnosis present

## 2020-01-11 DIAGNOSIS — D696 Thrombocytopenia, unspecified: Secondary | ICD-10-CM | POA: Diagnosis not present

## 2020-01-11 DIAGNOSIS — A4189 Other specified sepsis: Secondary | ICD-10-CM | POA: Diagnosis present

## 2020-01-11 DIAGNOSIS — J189 Pneumonia, unspecified organism: Secondary | ICD-10-CM | POA: Diagnosis not present

## 2020-01-11 DIAGNOSIS — J811 Chronic pulmonary edema: Secondary | ICD-10-CM | POA: Diagnosis not present

## 2020-01-11 DIAGNOSIS — J9601 Acute respiratory failure with hypoxia: Secondary | ICD-10-CM | POA: Diagnosis not present

## 2020-01-11 DIAGNOSIS — R0602 Shortness of breath: Secondary | ICD-10-CM

## 2020-01-11 DIAGNOSIS — J9690 Respiratory failure, unspecified, unspecified whether with hypoxia or hypercapnia: Secondary | ICD-10-CM | POA: Diagnosis not present

## 2020-01-11 DIAGNOSIS — J90 Pleural effusion, not elsewhere classified: Secondary | ICD-10-CM

## 2020-01-11 DIAGNOSIS — R7989 Other specified abnormal findings of blood chemistry: Secondary | ICD-10-CM

## 2020-01-11 DIAGNOSIS — A7741 Ehrlichiosis chafeensis [E. chafeensis]: Secondary | ICD-10-CM | POA: Diagnosis not present

## 2020-01-11 DIAGNOSIS — R7401 Elevation of levels of liver transaminase levels: Secondary | ICD-10-CM

## 2020-01-11 DIAGNOSIS — J9811 Atelectasis: Secondary | ICD-10-CM | POA: Diagnosis not present

## 2020-01-11 DIAGNOSIS — E871 Hypo-osmolality and hyponatremia: Secondary | ICD-10-CM | POA: Diagnosis present

## 2020-01-11 DIAGNOSIS — R0902 Hypoxemia: Secondary | ICD-10-CM | POA: Diagnosis not present

## 2020-01-11 DIAGNOSIS — R05 Cough: Secondary | ICD-10-CM | POA: Diagnosis not present

## 2020-01-11 DIAGNOSIS — K7689 Other specified diseases of liver: Secondary | ICD-10-CM | POA: Diagnosis present

## 2020-01-11 DIAGNOSIS — A7749 Other ehrlichiosis: Secondary | ICD-10-CM | POA: Diagnosis present

## 2020-01-11 DIAGNOSIS — R069 Unspecified abnormalities of breathing: Secondary | ICD-10-CM

## 2020-01-11 DIAGNOSIS — J80 Acute respiratory distress syndrome: Secondary | ICD-10-CM | POA: Diagnosis not present

## 2020-01-11 DIAGNOSIS — R188 Other ascites: Secondary | ICD-10-CM | POA: Diagnosis not present

## 2020-01-11 DIAGNOSIS — R918 Other nonspecific abnormal finding of lung field: Secondary | ICD-10-CM

## 2020-01-11 DIAGNOSIS — A7740 Ehrlichiosis, unspecified: Secondary | ICD-10-CM | POA: Diagnosis not present

## 2020-01-11 LAB — ECHOCARDIOGRAM COMPLETE
Height: 62 in
Weight: 1712 oz

## 2020-01-11 LAB — CBC
HCT: 37 % (ref 36.0–46.0)
HCT: 36.8 % (ref 36.0–46.0)
Hemoglobin: 12.7 g/dL (ref 12.0–15.0)
Hemoglobin: 12.8 g/dL (ref 12.0–15.0)
MCH: 31.5 pg (ref 26.0–34.0)
MCH: 32 pg (ref 26.0–34.0)
MCHC: 34.3 g/dL (ref 30.0–36.0)
MCHC: 34.8 g/dL (ref 30.0–36.0)
MCV: 91.8 fL (ref 80.0–100.0)
MCV: 92 fL (ref 80.0–100.0)
Platelets: 65 10*3/uL — ABNORMAL LOW (ref 150–400)
Platelets: 63 10*3/uL — ABNORMAL LOW (ref 150–400)
RBC: 4.03 MIL/uL (ref 3.87–5.11)
RBC: 4 MIL/uL (ref 3.87–5.11)
RDW: 11.9 % (ref 11.5–15.5)
RDW: 11.9 % (ref 11.5–15.5)
WBC: 2.1 10*3/uL — ABNORMAL LOW (ref 4.0–10.5)
WBC: 1.9 10*3/uL — ABNORMAL LOW (ref 4.0–10.5)
nRBC: 0 % (ref 0.0–0.2)
nRBC: 0 % (ref 0.0–0.2)

## 2020-01-11 LAB — COMPREHENSIVE METABOLIC PANEL
ALT: 349 U/L — ABNORMAL HIGH (ref 0–44)
AST: 908 U/L — ABNORMAL HIGH (ref 15–41)
Albumin: 3.1 g/dL — ABNORMAL LOW (ref 3.5–5.0)
Alkaline Phosphatase: 489 U/L — ABNORMAL HIGH (ref 38–126)
Anion gap: 7 (ref 5–15)
BUN: 8 mg/dL (ref 8–23)
CO2: 24 mmol/L (ref 22–32)
Calcium: 7.8 mg/dL — ABNORMAL LOW (ref 8.9–10.3)
Chloride: 89 mmol/L — ABNORMAL LOW (ref 98–111)
Creatinine, Ser: 0.62 mg/dL (ref 0.44–1.00)
GFR calc Af Amer: >60 mL/min (ref >60)
GFR calc non Af Amer: >60 mL/min (ref >60)
Glucose, Bld: 119 mg/dL — ABNORMAL HIGH (ref 70–99)
Potassium: 3.9 mmol/L (ref 3.5–5.1)
Sodium: 120 mmol/L — ABNORMAL LOW (ref 135–145)
Total Bilirubin: 0.8 mg/dL (ref 0.3–1.2)
Total Protein: 5.9 g/dL — ABNORMAL LOW (ref 6.5–8.1)

## 2020-01-11 LAB — URINALYSIS, ROUTINE W REFLEX MICROSCOPIC
Bilirubin Urine: NEGATIVE
Glucose, UA: NEGATIVE mg/dL
Hgb urine dipstick: NEGATIVE
Ketones, ur: NEGATIVE mg/dL
Leukocytes,Ua: NEGATIVE
Nitrite: NEGATIVE
Protein, ur: NEGATIVE mg/dL
Specific Gravity, Urine: 1.002 — ABNORMAL LOW (ref 1.005–1.030)
pH: 7 (ref 5.0–8.0)

## 2020-01-11 LAB — DIFFERENTIAL
Abs Immature Granulocytes: 0.01 10*3/uL (ref 0.00–0.07)
Basophils Absolute: 0 10*3/uL (ref 0.0–0.1)
Basophils Relative: 1 %
Eosinophils Absolute: 0 10*3/uL (ref 0.0–0.5)
Eosinophils Relative: 0 %
Immature Granulocytes: 1 %
Lymphocytes Relative: 18 %
Lymphs Abs: 0.4 10*3/uL — ABNORMAL LOW (ref 0.7–4.0)
Monocytes Absolute: 0.1 10*3/uL (ref 0.1–1.0)
Monocytes Relative: 3 %
Neutro Abs: 1.5 10*3/uL — ABNORMAL LOW (ref 1.7–7.7)
Neutrophils Relative %: 77 %
WBC Morphology: INCREASED

## 2020-01-11 LAB — RESP PANEL BY RT PCR (RSV, FLU A&B, COVID)
Influenza A by PCR: NEGATIVE
Influenza B by PCR: NEGATIVE
Respiratory Syncytial Virus by PCR: NEGATIVE
SARS Coronavirus 2 by RT PCR: NEGATIVE

## 2020-01-11 LAB — HEPATITIS B SURFACE ANTIGEN: Hepatitis B Surface Ag: NONREACTIVE

## 2020-01-11 LAB — CK: Total CK: 167 U/L (ref 38–234)

## 2020-01-11 LAB — LIPASE, BLOOD: Lipase: 41 U/L (ref 11–51)

## 2020-01-11 LAB — PROTIME-INR
INR: 1 (ref 0.8–1.2)
Prothrombin Time: 12.3 seconds (ref 11.4–15.2)

## 2020-01-11 LAB — OSMOLALITY: Osmolality: 263 mOsm/kg — ABNORMAL LOW (ref 275–295)

## 2020-01-11 LAB — HEPATITIS A ANTIBODY, IGM: Hep A IgM: NONREACTIVE

## 2020-01-11 LAB — OSMOLALITY, URINE: Osmolality, Ur: 83 mOsm/kg — ABNORMAL LOW (ref 300–900)

## 2020-01-11 LAB — D-DIMER, QUANTITATIVE: D-Dimer, Quant: 7.03 ug/mL-FEU — ABNORMAL HIGH (ref 0.00–0.50)

## 2020-01-11 LAB — BRAIN NATRIURETIC PEPTIDE: B Natriuretic Peptide: 66.5 pg/mL (ref 0.0–100.0)

## 2020-01-11 LAB — HEPATITIS B CORE ANTIBODY, TOTAL: Hep B Core Total Ab: NONREACTIVE

## 2020-01-11 LAB — SARS CORONAVIRUS 2 BY RT PCR (HOSPITAL ORDER, PERFORMED IN ~~LOC~~ HOSPITAL LAB): SARS Coronavirus 2: NEGATIVE

## 2020-01-11 LAB — TROPONIN I (HIGH SENSITIVITY)
Troponin I (High Sensitivity): 88 ng/L — ABNORMAL HIGH (ref <18)
Troponin I (High Sensitivity): 123 ng/L (ref <18)

## 2020-01-11 LAB — SODIUM, URINE, RANDOM: Sodium, Ur: <10 mmol/L

## 2020-01-11 MED ORDER — SODIUM CHLORIDE 0.9 % IV SOLN
1.0000 g | INTRAVENOUS | Status: DC
  Administered 2020-01-12 – 2020-01-14 (×3): 1 g via INTRAVENOUS
  Filled 2020-01-11: qty 1
  Filled 2020-01-11 (×3): qty 10
  Filled 2020-01-11: qty 1

## 2020-01-11 MED ORDER — ONDANSETRON HCL 4 MG/2ML IJ SOLN: 4.0000 mg | Freq: Four times a day (QID) | INTRAMUSCULAR | Status: DC | PRN

## 2020-01-11 MED ORDER — ASPIRIN EC 81 MG PO TBEC
81.0000 mg | DELAYED_RELEASE_TABLET | Freq: Every day | ORAL | Status: DC
  Administered 2020-01-11 – 2020-01-13 (×3): 81 mg via ORAL
  Filled 2020-01-11 (×3): qty 1

## 2020-01-11 MED ORDER — TURMERIC 500 MG PO CAPS: ORAL_CAPSULE | Freq: Every day | ORAL | Status: DC

## 2020-01-11 MED ORDER — AZITHROMYCIN 250 MG PO TABS: 500.0000 mg | ORAL_TABLET | Freq: Every day | ORAL | Status: DC

## 2020-01-11 MED ORDER — ACETAMINOPHEN 650 MG RE SUPP: 650.0000 mg | Freq: Four times a day (QID) | RECTAL | Status: DC | PRN

## 2020-01-11 MED ORDER — SODIUM CHLORIDE 0.9 % IV SOLN
500.0000 mg | Freq: Once | INTRAVENOUS | Status: AC
  Administered 2020-01-11: 500 mg via INTRAVENOUS
  Filled 2020-01-11: qty 500

## 2020-01-11 MED ORDER — BUDESONIDE 0.25 MG/2ML IN SUSP
0.2500 mg | Freq: Two times a day (BID) | RESPIRATORY_TRACT | Status: DC
  Administered 2020-01-11 – 2020-01-13 (×4): 0.25 mg via RESPIRATORY_TRACT
  Filled 2020-01-11 (×5): qty 2

## 2020-01-11 MED ORDER — SODIUM CHLORIDE 0.9 % IV SOLN: INTRAVENOUS | Status: DC

## 2020-01-11 MED ORDER — SODIUM CHLORIDE 0.9% FLUSH
3.0000 mL | Freq: Once | INTRAVENOUS | Status: AC
  Administered 2020-01-11: 3 mL via INTRAVENOUS

## 2020-01-11 MED ORDER — SODIUM CHLORIDE 0.9 % IV BOLUS
1000.0000 mL | Freq: Once | INTRAVENOUS | Status: AC
  Administered 2020-01-11: 1000 mL via INTRAVENOUS

## 2020-01-11 MED ORDER — BENZONATATE 100 MG PO CAPS
200.0000 mg | ORAL_CAPSULE | Freq: Three times a day (TID) | ORAL | Status: DC
  Administered 2020-01-11 – 2020-01-18 (×17): 200 mg via ORAL
  Filled 2020-01-11 (×18): qty 2

## 2020-01-11 MED ORDER — CALCIUM CARBONATE 1250 (500 CA) MG PO TABS
1.0000 | ORAL_TABLET | Freq: Every day | ORAL | Status: DC
  Administered 2020-01-12 – 2020-01-18 (×7): 500 mg via ORAL
  Filled 2020-01-11 (×7): qty 1

## 2020-01-11 MED ORDER — CALCIUM 75 MG PO TABS: ORAL_TABLET | Freq: Every day | ORAL | Status: DC

## 2020-01-11 MED ORDER — IOHEXOL 350 MG/ML SOLN
75.0000 mL | Freq: Once | INTRAVENOUS | Status: AC | PRN
  Administered 2020-01-11: 75 mL via INTRAVENOUS

## 2020-01-11 MED ORDER — SODIUM CHLORIDE 0.9 % IV SOLN
1.0000 g | Freq: Once | INTRAVENOUS | Status: AC
  Administered 2020-01-11: 1 g via INTRAVENOUS
  Filled 2020-01-11: qty 10

## 2020-01-11 MED ORDER — OXYCODONE HCL 5 MG PO TABS: 5.0000 mg | ORAL_TABLET | ORAL | Status: DC | PRN

## 2020-01-11 MED ORDER — ONDANSETRON HCL 4 MG PO TABS: 4.0000 mg | ORAL_TABLET | Freq: Four times a day (QID) | ORAL | Status: DC | PRN

## 2020-01-11 MED ORDER — IPRATROPIUM-ALBUTEROL 0.5-2.5 (3) MG/3ML IN SOLN
3.0000 mL | Freq: Four times a day (QID) | RESPIRATORY_TRACT | Status: DC | PRN
  Administered 2020-01-12: 3 mL via RESPIRATORY_TRACT
  Filled 2020-01-11: qty 3

## 2020-01-11 MED ORDER — IPRATROPIUM-ALBUTEROL 0.5-2.5 (3) MG/3ML IN SOLN
3.0000 mL | Freq: Four times a day (QID) | RESPIRATORY_TRACT | Status: DC
  Administered 2020-01-11 – 2020-01-12 (×4): 3 mL via RESPIRATORY_TRACT
  Filled 2020-01-11 (×4): qty 3

## 2020-01-11 MED ORDER — ADULT MULTIVITAMIN W/MINERALS CH
1.0000 | ORAL_TABLET | Freq: Every day | ORAL | Status: DC
  Administered 2020-01-12 – 2020-01-18 (×7): 1 via ORAL
  Filled 2020-01-11 (×7): qty 1

## 2020-01-11 MED ORDER — ACETAMINOPHEN 500 MG PO TABS
1000.0000 mg | ORAL_TABLET | Freq: Once | ORAL | Status: AC
  Administered 2020-01-11: 1000 mg via ORAL
  Filled 2020-01-11: qty 2

## 2020-01-11 MED ORDER — ACETAMINOPHEN 325 MG PO TABS
650.0000 mg | ORAL_TABLET | Freq: Four times a day (QID) | ORAL | Status: DC | PRN
  Administered 2020-01-13 – 2020-01-14 (×3): 650 mg via ORAL
  Filled 2020-01-11 (×3): qty 2

## 2020-01-11 NOTE — ED Notes (Signed)
Pt SpO2 decreased to 87% despite increasing [place] to 6L. Pt moved to  nonrebreather, SpO2 maintaining above 94%. Dr. Roosevelt Locks present at bedside at time of oxygen decrease, orders placed.

## 2020-01-11 NOTE — ED Notes (Signed)
Pt SpO2 decreased to 88%. 2L [place] applied, SpO2 increased to 90%. Increased [place] to 3L, SpO2 currently 94-96%. MD notified.

## 2020-01-11 NOTE — Consult Note (Signed)
Cardiology Consultation:   Patient ID: Kristina Myers MRN: [id number]; DOB: 09/12/1945  Admit date: 01/11/2020 Date of Consult: 01/11/2020  Primary Care Provider: Kelton Pillar, MD Primary Cardiologist::  NEW Primary Electrophysiologist:  None    Patient Profile:   Kristina Myers is a 74 y.o. female with no significant PMH who is being seen today for the evaluation of SOB at the request of Kristina Fines, MD.  History of Present Illness:   Ms. [name] is a 74yo female with a hx of arthritis who was in her USOH until a few days ago after getting the first shingles vaccine.  She tells me that on Tuesday she got her 1st shingles vaccine and then 3 days ago developed nausea/vomiting and decreased appetite and loose stools.  She also had chills as well as a cough and SOB.  Her symptoms worsened through the weekend and she was seen at an Urgent Care in [place] and O2 sats were 88% and was told she needed to be admitted for further evaluation.  She waited until today and drove back to [facility] and came to the ER.    Cxray in ER showed bilateral patchy infiltrates.  She is leukopenic with elevated LFTs and low platelet count.  COVID test negative.Pulmonary critical care medicine saw her and are concerned for possible Legionella due to associated hyponatremia/elevated LFTs.  She has been started on antibx.  Cardiology is asked to see due to elevated hsTrop as well as possible component of CHF.  Her BNp is normal at 65.  hsTrop was 88>123.  2D echo showed normal LVF with no RWMAs.  She denies any chest pain or pressure.  She has SOB with significant cough.  She has never smoked and has no fm hx of CAD.   Past Medical History:  Diagnosis Date  . Arthritis     Past Surgical History:  Procedure Laterality Date  . CESAREAN SECTION    . TUBAL LIGATION       Home Medications:  Prior to Admission medications   Medication Sig Start Date End Date Taking? Authorizing Provider  CALCIUM PO  Take 1 tablet by mouth daily.   Yes [provider]  Multiple Vitamin (MULTIVITAMIN ADULT PO) Take 1 tablet by mouth daily.   Yes [provider]  ondansetron (ZOFRAN-ODT) 4 MG disintegrating tablet Take 4 mg by mouth every 8 (eight) hours as needed for nausea or vomiting.  01/10/20  Yes [provider]  TURMERIC PO Take 1 tablet by mouth daily.   Yes [provider]    Inpatient Medications: Scheduled Meds: . aspirin EC  81 mg Oral Daily  . [START ON 01/12/2020] azithromycin  500 mg Oral Daily  . benzonatate  200 mg Oral TID  . budesonide (PULMICORT) nebulizer solution  0.25 mg Nebulization BID  . [START ON 01/12/2020] calcium carbonate  1 tablet Oral Q breakfast  . ipratropium-albuterol  3 mL Nebulization Q6H  . multivitamin with minerals  1 tablet Oral Daily   Continuous Infusions: . [START ON 01/12/2020] cefTRIAXone (ROCEPHIN)  IV     PRN Meds: acetaminophen **OR** acetaminophen, ipratropium-albuterol, ondansetron **OR** ondansetron (ZOFRAN) IV, oxyCODONE  Allergies:    Allergies  Allergen Reactions  . Penicillins     Social History:   Social History   Socioeconomic History  . Marital status: Married    Spouse name: Not on file  . Number of children: Not on file  . Years of education: Not on file  . Highest  education level: Not on file  Occupational History  . Not on file  Tobacco Use  . Smoking status: Never Smoker  . Smokeless tobacco: Never Used  Substance and Sexual Activity  . Alcohol use: Yes    Alcohol/week: 6.0 standard drinks    Types: 6 Standard drinks or equivalent per week  . Drug use: No  . Sexual activity: Not on file  Other Topics Concern  . Not on file  Social History Narrative  . Not on file   Social Determinants of Health   Financial Resource Strain:   . Difficulty of Paying Living Expenses:   Food Insecurity:   . Worried About Charity fundraiser in the Last Year:   . Arboriculturist in the Last Year:     Transportation Needs:   . Film/video editor (Medical):   Marland Kitchen Lack of Transportation (Non-Medical):   Physical Activity:   . Days of Exercise per Week:   . Minutes of Exercise per Session:   Stress:   . Feeling of Stress :   Social Connections:   . Frequency of Communication with Friends and Family:   . Frequency of Social Gatherings with Friends and Family:   . Attends Religious Services:   . Active Member of Clubs or Organizations:   . Attends Archivist Meetings:   Marland Kitchen Marital Status:   Intimate Partner Violence:   . Fear of Current or Ex-Partner:   . Emotionally Abused:   Marland Kitchen Physically Abused:   . Sexually Abused:     Family History:    Family History  Problem Relation Age of Onset  . Cancer Mother        Breast  . Breast cancer Mother   . Mental illness Father   . Mental illness Paternal Grandmother      ROS:  Please see the history of present illness.   All other ROS reviewed and negative.     Physical Exam/Data:   Vitals:   01/11/20 1230 01/11/20 1330 01/11/20 1445 01/11/20 2039  BP: 119/80 119/76 105/70 132/80  Pulse: (!) 110 100 97 100  Resp: (!) 28 (!) 23 (!) 24 (!) 29  Temp:      TempSrc:      SpO2: 91% 99% 100% 97%  Weight:      Height:       No intake or output data in the 24 hours ending 01/11/20 2056 Last 3 Weights 01/11/2020 11/07/2017 05/29/2017  Weight (lbs) 107 lb 109 lb 108 lb 6.4 oz  Weight (kg) 48.535 kg 49.442 kg 49.17 kg     Body mass index is 19.57 kg/m.  General:  Well nourished, well developed, in moderate respiratory distress HEENT: normal Lymph: no adenopathy Neck: no JVD Endocrine:  No thryomegaly Vascular: No carotid bruits; FA pulses 2+ bilaterally without bruits  Cardiac:  normal S1, S2; RRR; no murmur  Lungs: diffuse crackles throughout Abd: soft, nontender, no hepatomegaly  Ext: no edema Musculoskeletal:  No deformities, BUE and BLE strength normal and equal Skin: warm and dry  Neuro:  CNs 2-12 intact, no  focal abnormalities noted Psych:  Normal affect   EKG:  The EKG was personally reviewed and demonstrates:  NSR Telemetry:  Telemetry was personally reviewed and demonstrates:  NSR with no ST changes  Relevant CV Studies: 2D echo 01/11/2020 IMPRESSIONS    1. Left ventricular ejection fraction, by estimation, is 60 to 65%. The  left ventricle has normal function. The left ventricle  has no regional  wall motion abnormalities. Left ventricular diastolic parameters are  consistent with Grade I diastolic  dysfunction (impaired relaxation).  2. Right ventricular systolic function is normal. The right ventricular  size is normal.  3. The mitral valve is grossly normal. Trivial mitral valve  regurgitation.  4. The aortic valve is tricuspid. Aortic valve regurgitation is not  visualized.  5. The inferior vena cava is dilated in size with >50% respiratory  variability, suggesting right atrial pressure of 8 mmHg.   Laboratory Data:  High Sensitivity Troponin:   Recent Labs  Lab 01/11/20 1253 01/11/20 1602  TROPONINIHS 88* 123*     Chemistry Recent Labs  Lab 01/11/20 0832  NA 120*  K 3.9  CL 89*  CO2 24  GLUCOSE 119*  BUN 8  CREATININE 0.62  CALCIUM 7.8*  GFRNONAA >60  GFRAA >60  ANIONGAP 7    Recent Labs  Lab 01/11/20 0832  PROT 5.9*  ALBUMIN 3.1*  AST 908*  ALT 349*  ALKPHOS 489*  BILITOT 0.8   Hematology Recent Labs  Lab 01/11/20 0832 01/11/20 1044  WBC 2.1* 1.9*  RBC 4.03 4.00  HGB 12.7 12.8  HCT 37.0 36.8  MCV 91.8 92.0  MCH 31.5 32.0  MCHC 34.3 34.8  RDW 11.9 11.9  PLT 65* 63*   BNP Recent Labs  Lab 01/11/20 1312  BNP 66.5    DDimer  Recent Labs  Lab 01/11/20 1044  DDIMER 7.03*     Radiology/Studies:  DG Chest 2 View  Result Date: 01/11/2020 CLINICAL DATA:  Cough, nausea, headache, recent shingles vaccine. EXAM: CHEST - 2 VIEW COMPARISON:  09/14/2013 chest radiograph. FINDINGS: Stable cardiomediastinal silhouette with normal heart  size. No pneumothorax. No pleural effusion. New diffuse streaky parahilar opacities throughout both lungs. IMPRESSION: New diffuse streaky parahilar opacities throughout both lungs, which could represent atypical pneumonia or pulmonary edema. Recommend follow-up chest radiographs. Electronically Signed   By: Ilona Sorrel M.D.   On: 01/11/2020 08:45   CT Angio Chest PE W and/or Wo Contrast  Result Date: 01/11/2020 CLINICAL DATA:  Cough, chills, and vomiting. EXAM: CT ANGIOGRAPHY CHEST WITH CONTRAST TECHNIQUE: Multidetector CT imaging of the chest was performed using the standard protocol during bolus administration of intravenous contrast. Multiplanar CT image reconstructions and MIPs were obtained to evaluate the vascular anatomy. CONTRAST:  51mL OMNIPAQUE IOHEXOL 350 MG/ML SOLN COMPARISON:  Chest x-ray from same day. FINDINGS: Cardiovascular: Satisfactory opacification of the pulmonary arteries to the segmental level. No evidence of pulmonary embolism. Normal heart size. No pericardial effusion. No thoracic aortic aneurysm or dissection. Mediastinum/Nodes: No enlarged mediastinal, hilar, or axillary lymph nodes. Tiny subcentimeter hypodense nodules and calcifications in the thyroid gland. Not clinically significant; no follow-up imaging recommended. Trachea and esophagus demonstrate no significant findings. Lungs/Pleura: Upper lobe predominant smooth interlobular septal thickening. Upper lobe predominant patchy and confluent peribronchovascular ground-glass densities. Dependent atelectasis in both lower lobes. Small to moderate bilateral pleural effusions. No pneumothorax. Upper Abdomen: Small perihepatic ascites. Air anterior to the liver within largely decompressed transverse colon. Musculoskeletal: No chest wall abnormality. No acute or significant osseous findings. Review of the MIP images confirms the above findings. IMPRESSION: 1. No evidence of pulmonary embolism. 2. Upper lobe predominant smooth  interlobular septal thickening with patchy and confluent peribronchovascular ground-glass densities, concerning for pulmonary edema, less likely atypical infection. 3. Small to moderate bilateral pleural effusions. 4. Small perihepatic ascites. Electronically Signed   By: Titus Dubin M.D.   On: 01/11/2020 15:18  ECHOCARDIOGRAM COMPLETE  Result Date: 01/11/2020    ECHOCARDIOGRAM REPORT   Patient Name:   KHANIYAH BEZEK Date of Exam: 01/11/2020 Medical Rec #:  [id number]                 Height:       62.0 in Accession #:    [id number]                Weight:       107.0 lb Date of Birth:  [birth year]                BSA:          1.465 m Patient Age:    31 years                  BP:           105/70 mmHg Patient Gender: F                         HR:           96 bpm. Exam Location:  Inpatient Procedure: 2D Echo Indications:    Dyspnea 786.09 / R06.00  History:        Patient has no prior history of Echocardiogram examinations.                 Risk Factors:Non-Smoker. Pneumonia.  Sonographer:    Leavy Cella Referring Phys: [id number] [place]  1. Left ventricular ejection fraction, by estimation, is 60 to 65%. The left ventricle has normal function. The left ventricle has no regional wall motion abnormalities. Left ventricular diastolic parameters are consistent with Grade I diastolic dysfunction (impaired relaxation).  2. Right ventricular systolic function is normal. The right ventricular size is normal.  3. The mitral valve is grossly normal. Trivial mitral valve regurgitation.  4. The aortic valve is tricuspid. Aortic valve regurgitation is not visualized.  5. The inferior vena cava is dilated in size with >50% respiratory variability, suggesting right atrial pressure of 8 mmHg. FINDINGS  Left Ventricle: Left ventricular ejection fraction, by estimation, is 60 to 65%. The left ventricle has normal function. The left ventricle has no regional wall motion abnormalities. The left  ventricular internal cavity size was normal in size. There is  no left ventricular hypertrophy. Left ventricular diastolic parameters are consistent with Grade I diastolic dysfunction (impaired relaxation). Indeterminate filling pressures. Right Ventricle: The right ventricular size is normal. No increase in right ventricular wall thickness. Right ventricular systolic function is normal. Left Atrium: Left atrial size was normal in size. Right Atrium: Right atrial size was normal in size. Pericardium: There is no evidence of pericardial effusion. Mitral Valve: The mitral valve is grossly normal. Trivial mitral valve regurgitation. Tricuspid Valve: The tricuspid valve is grossly normal. Tricuspid valve regurgitation is trivial. Aortic Valve: The aortic valve is tricuspid. Aortic valve regurgitation is not visualized. Pulmonic Valve: The pulmonic valve was normal in structure. Pulmonic valve regurgitation is not visualized. Aorta: The aortic root and ascending aorta are structurally normal, with no evidence of dilitation. Venous: The inferior vena cava is dilated in size with greater than 50% respiratory variability, suggesting right atrial pressure of 8 mmHg. IAS/Shunts: No atrial level shunt detected by color flow Doppler. Additional Comments: There is a small pleural effusion in the right lateral region. Mild ascites is present.  LEFT VENTRICLE PLAX 2D LVIDd:         4.80  cm  Diastology LVIDs:         3.40 cm  LV e' lateral:   8.05 cm/s LV PW:         0.80 cm  LV E/e' lateral: 10.2 LV IVS:        0.90 cm  LV e' medial:    8.16 cm/s LVOT diam:     1.90 cm  LV E/e' medial:  10.0 LVOT Area:     2.84 cm  RIGHT VENTRICLE RV S prime:     21.30 cm/s TAPSE (M-mode): 2.2 cm LEFT ATRIUM           Index LA diam:      2.40 cm 1.64 cm/m LA Vol (A2C): 49.8 ml 33.98 ml/m LA Vol (A4C): 39.2 ml 26.75 ml/m   AORTA Ao Root diam: 3.00 cm MITRAL VALVE MV Area (PHT): 5.27 cm    SHUNTS MV Decel Time: 144 msec    Systemic Diam: 1.90  cm MV E velocity: 82.00 cm/s MV A velocity: 42.70 cm/s MV E/A ratio:  1.92 Lyman Bishop MD Electronically signed by Lyman Bishop MD Signature Date/Time: 01/11/2020/4:49:56 PM    Final    US Abdomen Limited RUQ  Result Date: 01/11/2020 CLINICAL DATA:  Nausea and right upper quadrant pain since Thursday. EXAM: ULTRASOUND ABDOMEN LIMITED RIGHT UPPER QUADRANT COMPARISON:  None. FINDINGS: Gallbladder: Sludge. No gallstones. Asymmetric wall thickening and edema measuring up to 9 mm. No sonographic Murphy sign noted by sonographer. Common bile duct: Diameter: 5 mm, normal. Liver: 7 x 9 x 8 mm echogenic lesion in the right. Within normal limits in parenchymal echogenicity. Portal vein is patent on color Doppler imaging with normal direction of blood flow towards the liver. Other: Small perihepatic ascites.  Small right pleural effusion. IMPRESSION: 1. Gallbladder sludge with asymmetric wall thickening and edema. Given small perihepatic ascites, small right pleural effusion, and elevated LFTs, the gallbladder appearance is favored related to underlying liver disease, although acalculus cholecystitis is not entirely excluded. 2. Small 0.9 cm hyperechoic lesion in the right lobe of the liver, probably a hemangioma, benign. If definitive characterization is required, consider follow-up MRI abdomen without and with MultiHance in 6 months. Electronically Signed   By: Titus Dubin M.D.   On: 01/11/2020 12:42         Assessment and Plan:   1. Acute respiratory failure with hypoxia -started after developing what sounds like a respiratory illness with chills, cough, N/V -now with hypoxia -CCM following and concerned over possible Legionella due to coexisting, hepatic transaminitis, hyponatremia -continue antibx -BNP is normal  -chest CT neg for PE but does show bilateral pleural effusions, pulmonary edema or infection>>in setting of normal BNp in a thin patient, suspect this is not CHF  2.  Elevated  troponin -hsTrop mildly elevated with flat trend (88>123) -EKG is nonischemic and she has no CRFs except advanced age.  She has never smoked and has no fm hx of CAD -2D echo with normal LVF -this is consistent with demand ischemia -no further workup for ischemia indicated at this time -could consider outpt coronray CTA or stress test once she recovers from pulmonary issues  3.  Hyponatremia -Na 120 -suspect related to underlying pulmonary issue -per CCM  4.  Hepatic transaminitis  -likely related to acute viral/bacterial syndrome -continue to follow  5.  Leukopenic/Thrombocytopenic -? Related to bone marrow suppression from acute infection -need to consider tick borne diseases as well given her elevated LFTs, hyponatremia, bone marrow suppression, N/V/diarrhea  and body aches -consider ID consult      For questions or updates, please contact [place] Please consult [email or web address] for contact info under     Signed, Fransico Him, MD  01/11/2020 8:56 PM

## 2020-01-11 NOTE — ED Notes (Signed)
Regular dinner tray ordered 

## 2020-01-11 NOTE — Progress Notes (Addendum)
Trop went up from 88 to 123. Went to see the patient, and pt denied any chest pain same as earlier. Reviewed her Echo which showed a normal LVEF and no focal wall motion abnormality, but Grade I diastolic dysfunction. Consider this increase of trop related to acute hypoxia, will continue to monitor her BP and HR, repeat Trop level tomorrow AM.  Discussed with pt and her husband about the hyponatremia workup, which showed hypoosmolarity in both serum and urine and low urine sodium, implying a polydipsia. Pt admitted that she has poor oral in take since last Friday and has been only drinking water. Told her to use Gatorade for hydration for now.  D/W pulm attending about the finding on CT chest, who plans to see the patient in AM and make further recommendations.  D/W cardio attending, reviewed CT and Echo, who consider more likely pt has B/L PNA.

## 2020-01-11 NOTE — ED Provider Notes (Signed)
[facility] EMERGENCY DEPARTMENT Provider Note   CSN: [id number] Arrival date & time: 01/11/20  6144     History Chief Complaint  Patient presents with  . Nausea  . Headache    Kristina Myers is a 74 y.o. female.  HPI 74 year old female with history of arthritis presents to the ER with nausea, vomiting headache, chills, and cough since Wednesday.  History provided by patient and husband at bedside.  Patient states that she received the Shingrix vaccine on Tuesday 6/1, noted that she began to develop a headache, nausea, and vomiting on Wednesday.  She also endorsed intermittent chills, but none did not measure her temperature.  She stated that on Friday she began to have a nonproductive cough.  She was seen at urgent care yesterday, and was given some nausea medication and told that her oxygen level was lower than normal.  She states she has not been able to tolerate any food since Wednesday.  Has been able to intermittently drink Gatorade and keep it down but is still endorsing pretty consistent nausea.  Husband reports that she endorsed 1 episode of loose stool, but no blood.  Her husband also states that she has been intermittently confused, states that he does not know she cannot process what he saying or if she cannot hear what she is saying.  She denies any abdominal pain, vision changes, weakness.  Husband states that when she tries to walk she is fairly unsteady on her feet.  She denies any history of COPD or asthma.  She has not taken anything for her symptoms.  She has received with Covid vaccines.    Past Medical History:  Diagnosis Date  . Arthritis     Patient Active Problem List   Diagnosis Date Noted  . Community acquired pneumonia 01/11/2020  . Pneumonia 01/11/2020  . Pain in joint, ankle and foot 08/18/2014  . Hallux valgus with bunions 08/18/2014    Past Surgical History:  Procedure Laterality Date  . CESAREAN SECTION    . TUBAL  LIGATION       OB History   No obstetric history on file.     Family History  Problem Relation Age of Onset  . Cancer Mother        Breast  . Breast cancer Mother   . Mental illness Father   . Mental illness Paternal Grandmother     Social History   Tobacco Use  . Smoking status: Never Smoker  . Smokeless tobacco: Never Used  Substance Use Topics  . Alcohol use: Yes    Alcohol/week: 6.0 standard drinks    Types: 6 Standard drinks or equivalent per week  . Drug use: No    Home Medications Prior to Admission medications   Medication Sig Start Date End Date Taking? Authorizing Provider  CALCIUM PO Take 1 tablet by mouth daily.   Yes [provider]  Multiple Vitamin (MULTIVITAMIN ADULT PO) Take 1 tablet by mouth daily.   Yes [provider]  ondansetron (ZOFRAN-ODT) 4 MG disintegrating tablet Take 4 mg by mouth every 8 (eight) hours as needed for nausea or vomiting.  01/10/20  Yes [provider]  TURMERIC PO Take 1 tablet by mouth daily.   Yes [provider]    Allergies    Penicillins  Review of Systems   Review of Systems  Constitutional: Positive for chills. Negative for fever.  HENT: Negative for ear pain and sore throat.   Eyes: Negative for  pain and visual disturbance.  Respiratory: Positive for cough and shortness of breath.   Cardiovascular: Negative for chest pain and palpitations.  Gastrointestinal: Positive for diarrhea, nausea and vomiting. Negative for abdominal pain.  Genitourinary: Negative for dysuria and hematuria.  Musculoskeletal: Negative for arthralgias, back pain, neck pain and neck stiffness.  Skin: Negative for color change and rash.  Neurological: Positive for dizziness and weakness. Negative for seizures, syncope and numbness.  Psychiatric/Behavioral: Positive for confusion.  All other systems reviewed and are negative.   Physical Exam Updated Vital Signs BP 119/80   Pulse (!) 110   Temp 98.6 F  (37 C) (Oral)   Resp (!) 28   Ht 5\' 2"  (1.575 m)   Wt 48.5 kg   SpO2 91%   BMI 19.57 kg/m   Physical Exam Vitals and nursing note reviewed.  Constitutional:      General: She is not in acute distress.    Appearance: She is well-developed. She is not ill-appearing, toxic-appearing or diaphoretic.  HENT:     Head: Normocephalic and atraumatic.     Nose: Nose normal.     Mouth/Throat:     Mouth: Mucous membranes are moist.     Pharynx: Oropharynx is clear.  Eyes:     Conjunctiva/sclera: Conjunctivae normal.  Cardiovascular:     Rate and Rhythm: Normal rate and regular rhythm.     Pulses: Normal pulses.     Heart sounds: Normal heart sounds. No murmur.  Pulmonary:     Effort: Pulmonary effort is normal. No respiratory distress.     Breath sounds: Examination of the right-lower field reveals rhonchi and rales. Examination of the left-lower field reveals rhonchi and rales. Rhonchi and rales present. No wheezing.  Abdominal:     General: Abdomen is flat.     Palpations: Abdomen is soft.     Tenderness: There is no abdominal tenderness. There is no guarding.  Musculoskeletal:        General: No tenderness. Normal range of motion.     Cervical back: Normal range of motion and neck supple.  Skin:    General: Skin is warm and dry.     Coloration: Skin is not jaundiced or pale.     Findings: No erythema.  Neurological:     General: No focal deficit present.     Mental Status: She is alert and oriented to person, place, and time.     Cranial Nerves: No cranial nerve deficit.     Sensory: No sensory deficit.     Motor: No weakness.     Coordination: Coordination normal.  Psychiatric:        Mood and Affect: Mood normal.        Behavior: Behavior normal.     ED Results / Procedures / Treatments   Labs (all labs ordered are listed, but only abnormal results are displayed) Labs Reviewed  COMPREHENSIVE METABOLIC PANEL - Abnormal; Notable for the following components:       Result Value   Sodium 120 (*)    Chloride 89 (*)    Glucose, Bld 119 (*)    Calcium 7.8 (*)    Total Protein 5.9 (*)    Albumin 3.1 (*)    AST 908 (*)    ALT 349 (*)    Alkaline Phosphatase 489 (*)    All other components within normal limits  CBC - Abnormal; Notable for the following components:   WBC 2.1 (*)    Platelets 65 (*)  All other components within normal limits  URINALYSIS, ROUTINE W REFLEX MICROSCOPIC - Abnormal; Notable for the following components:   Color, Urine STRAW (*)    Specific Gravity, Urine 1.002 (*)    All other components within normal limits  D-DIMER, QUANTITATIVE (NOT AT [facility] - Abnormal; Notable for the following components:   D-Dimer, Quant 7.03 (*)    All other components within normal limits  DIFFERENTIAL - Abnormal; Notable for the following components:   Neutro Abs 1.5 (*)    Lymphs Abs 0.4 (*)    All other components within normal limits  CBC - Abnormal; Notable for the following components:   WBC 1.9 (*)    Platelets 63 (*)    All other components within normal limits  SARS CORONAVIRUS 2 BY RT PCR (HOSPITAL ORDER, [place] [facility])  LIPASE, BLOOD  PROTIME-INR  CK  LEGIONELLA PNEUMOPHILA SEROGP 1 UR AG  MYCOPLASMA PNEUMONIAE ANTIBODY, IGM  OSMOLALITY  OSMOLALITY, URINE  SODIUM, URINE, RANDOM  BRAIN NATRIURETIC PEPTIDE  TROPONIN I (HIGH SENSITIVITY)    EKG EKG Interpretation  Date/Time:  Monday January 11 2020 10:27:36 EDT Ventricular Rate:  79 PR Interval:    QRS Duration: 99 QT Interval:  412 QTC Calculation: 473 R Axis:   83 Text Interpretation: Sinus rhythm Borderline right axis deviation No STEMI Confirmed by Octaviano Glow [phone]) on 01/11/2020 10:53:54 AM   Radiology DG Chest 2 View  Result Date: 01/11/2020 CLINICAL DATA:  Cough, nausea, headache, recent shingles vaccine. EXAM: CHEST - 2 VIEW COMPARISON:  09/14/2013 chest radiograph. FINDINGS: Stable cardiomediastinal silhouette with normal heart  size. No pneumothorax. No pleural effusion. New diffuse streaky parahilar opacities throughout both lungs. IMPRESSION: New diffuse streaky parahilar opacities throughout both lungs, which could represent atypical pneumonia or pulmonary edema. Recommend follow-up chest radiographs. Electronically Signed   By: Ilona Sorrel M.D.   On: 01/11/2020 08:45   US Abdomen Limited RUQ  Result Date: 01/11/2020 CLINICAL DATA:  Nausea and right upper quadrant pain since Thursday. EXAM: ULTRASOUND ABDOMEN LIMITED RIGHT UPPER QUADRANT COMPARISON:  None. FINDINGS: Gallbladder: Sludge. No gallstones. Asymmetric wall thickening and edema measuring up to 9 mm. No sonographic Murphy sign noted by sonographer. Common bile duct: Diameter: 5 mm, normal. Liver: 7 x 9 x 8 mm echogenic lesion in the right. Within normal limits in parenchymal echogenicity. Portal vein is patent on color Doppler imaging with normal direction of blood flow towards the liver. Other: Small perihepatic ascites.  Small right pleural effusion. IMPRESSION: 1. Gallbladder sludge with asymmetric wall thickening and edema. Given small perihepatic ascites, small right pleural effusion, and elevated LFTs, the gallbladder appearance is favored related to underlying liver disease, although acalculus cholecystitis is not entirely excluded. 2. Small 0.9 cm hyperechoic lesion in the right lobe of the liver, probably a hemangioma, benign. If definitive characterization is required, consider follow-up MRI abdomen without and with MultiHance in 6 months. Electronically Signed   By: Titus Dubin M.D.   On: 01/11/2020 12:42    Procedures Procedures (including critical care time)  Medications Ordered in ED Medications  acetaminophen (TYLENOL) tablet 1,000 mg (has no administration in time range)  multivitamin with minerals tablet 1 tablet (has no administration in time range)  0.9 %  sodium chloride infusion (has no administration in time range)  acetaminophen  (TYLENOL) tablet 650 mg (has no administration in time range)    Or  acetaminophen (TYLENOL) suppository 650 mg (has no administration in time range)  oxyCODONE (  Oxy IR/ROXICODONE) immediate release tablet 5 mg (has no administration in time range)  ondansetron (ZOFRAN) tablet 4 mg (has no administration in time range)    Or  ondansetron (ZOFRAN) injection 4 mg (has no administration in time range)  ipratropium-albuterol (DUONEB) 0.5-2.5 (3) MG/3ML nebulizer solution 3 mL (has no administration in time range)  ipratropium-albuterol (DUONEB) 0.5-2.5 (3) MG/3ML nebulizer solution 3 mL (has no administration in time range)  benzonatate (TESSALON) capsule 200 mg (has no administration in time range)  budesonide (PULMICORT) nebulizer solution 0.25 mg (has no administration in time range)  azithromycin (ZITHROMAX) tablet 500 mg (has no administration in time range)  cefTRIAXone (ROCEPHIN) 1 g in sodium chloride 0.9 % 100 mL IVPB (has no administration in time range)  calcium carbonate (OS-CAL - dosed in mg of elemental calcium) tablet 500 mg of elemental calcium (has no administration in time range)  sodium chloride flush (NS) 0.9 % injection 3 mL (3 mLs Intravenous Given 01/11/20 1042)  sodium chloride 0.9 % bolus 1,000 mL (1,000 mLs Intravenous New Bag/Given 01/11/20 1043)  cefTRIAXone (ROCEPHIN) 1 g in sodium chloride 0.9 % 100 mL IVPB (0 g Intravenous Stopped 01/11/20 1126)  azithromycin (ZITHROMAX) 500 mg in sodium chloride 0.9 % 250 mL IVPB (0 mg Intravenous Stopped 01/11/20 1328)    ED Course  I have reviewed the triage vital signs and the nursing notes.  Pertinent labs & imaging results that were available during my care of the patient were reviewed by me and considered in my medical decision making (see chart for details).   Clinical Course as of Jan 11 1332  Mon Jan 11, 2020  0850 IMPRESSION: New diffuse streaky parahilar opacities throughout both lungs, which could represent atypical  pneumonia or pulmonary edema. Recommend follow-up chest radiographs.   [MT]  0952 AST(!): 908 [MB]  0953 ALT(!): 349 [MB]  0953 Alkaline Phosphatase(!): 489 [MB]  0953 WBC(!): 2.1 [MB]  0959 This previously healthy 74 year old female presented emergency department with headache, cough, abdominal pain and vomiting.  She reports she has not eaten anything in the past 4 days.  She went to an urgent care where she was told her oxygen number was lower than normal.  She does not have any history of COPD or asthma or smoking.  Subsequently came to the ER today.  On exam the patient is overall well-appearing.  She is satting 90% on room air.  Her blood pressure stable.  She is afebrile.  She has absolutely no abdominal tenderness on exam and has a negative Murphy sign.  She does have some rhonchi and rales in the mid to lower lung fields.  X-ray shows patchy bilateral opacities could be consistent with a bilateral pneumonia or viral pneumonia.  She also has some leukopenia with a white blood cell count of 2.1.  Platelets are also low at 65.  Labs are also notable for transaminitis.  Low sodium.  Will obtain a right upper quadrant ultrasound.  Also give her some IV fluids.  Suspect she will need admission is unclear what is causing this constellation of symptoms.  We will also swab her for Covid.  She did receive both Covid vaccines.  This may be a viral syndrome still, including COVID.   [MT]  1002 No renal failure or anemia to suggest TTP   [MT]  1227 Admitted to hospitalist Dr Roosevelt Locks, pt now tachycardic with her hypoxia, ordered CT PE study.  No chest pain.  BP stable   [MT]  1327  RUQ US  IMPRESSION: 1. Gallbladder sludge with asymmetric wall thickening and edema. Given small perihepatic ascites, small right pleural effusion, and elevated LFTs, the gallbladder appearance is favored related to underlying liver disease, although acalculus cholecystitis is not entirely excluded. 2. Small 0.9 cm hyperechoic  lesion in the right lobe of the liver, probably a hemangioma, benign. If definitive characterization is required, consider follow-up MRI abdomen without and with MultiHance in 6 months.     [MB]    Clinical Course User Index [MB] Garald Balding, PA-C [MT] Wyvonnia Dusky, MD   MDM Rules/Calculators/A&P                     74 year old female with nausea, vomiting, cough, chills since Wednesday. On presentation, the patient is overall alert and oriented, nontoxic-appearing, in no acute respiratory distress.  Her oxygen saturations fluctuate, she appears to be satting between 98 to 92% on room air.  89% on initial presentation.   Other vitals are reassuring.  She is afebrile.  Physical exam with bilateral lower lobe rales and rhonchi; no abdominal tenderness.EKG normal sinus rhythm.  CBC with leukopenia, with WBCs 2.1.  Her platelets are also 65.  CMP with mild hyponatremia of 120, chloride, 89, most notably she has significant transaminitis.  Chest x-ray with bilateral hilar opacities with questionable viral pneumonia or pulmonary edema.  EKG normal sinus rhythm.  Right upper quadrant ultrasound ordered.  Patient given fluids, started on azithromycin Rocephin for pneumonia.  10: 20 a.m. nursing staff informed me that she was consistently satting 88% in the room, so she was placed on 3 L nasal cannula.  Pending right upper quadrant ultrasound.  UA without evidence of UTI or blood.   12:30 AM: Right upper quadrant ultrasound with gallbladder sludge and wall thickening, consider a probable hemangioma noted on the liver.  Spoke with Dr. Roosevelt Locks with hospitalist group, ordered CT PE study given tachycardia along with hypoxia, will admit to medicine for further evaluation and management.  Patient has remained hemodynamically stable throughout the ED course.  Patient was seen and evaluated by Dr. Oneta Rack, he is agreeable to the above plan.  Final Clinical Impression(s) / ED Diagnoses Final diagnoses:   Transaminitis  Leukopenia, unspecified type  Community acquired pneumonia, unspecified laterality  Thrombocytopenia ([place])  Hypoxia  Elevated d-dimer    Rx / DC Orders ED Discharge Orders    None       Garald Balding, PA-C 01/11/20 1337    Wyvonnia Dusky, MD 01/11/20 [phone]

## 2020-01-11 NOTE — H&P (Signed)
History and Physical    Kristina Myers DXI:[id number] DOB: 1946/05/14 DOA: 01/11/2020  PCP: Kelton Pillar, MD (Confirm with patient/family/NH records and if not entered, this has to be entered at [facility] point of entry) Patient coming from: Home  I have personally briefly reviewed patient's old medical records in [place]  Chief Complaint: Cough, headache, nausea.  HPI: Kristina Myers is a 74 y.o. female with no significant past medical history presented with new onset of dry cough, and then short of breath.  Patient lives with her husband at home, has been healthy until about 1 week ago.  Patient remembered she had a shingle vaccination about 1 week ago and then started to have dry cough.  3 days ago she gradually developed nausea, poor appetite, at one time through the weekend.  Her cough also got severe but still remained dry, and she also feels episode of chills.  But no fever.  She also started to have loose bowel movement 2-3 times yesterday.  No abdominal pain.  She also started to feel short of breath from yesterday exertional.  Denies any chest pains.  She woke up today with double headache, denied any vision changes or hearing changes no numbness or weakness of any of the limbs. ED Course: X-ray shows bilateral interstitial infiltrates, WBC 2.1, platelet 65, AST 908 ALT 349.  O2 saturation 98% on room air.  Review of Systems: As per HPI otherwise 10 point review of systems negative.    Past Medical History:  Diagnosis Date  . Arthritis     Past Surgical History:  Procedure Laterality Date  . CESAREAN SECTION    . TUBAL LIGATION       reports that she has never smoked. She has never used smokeless tobacco. She reports current alcohol use of about 6.0 standard drinks of alcohol per week. She reports that she does not use drugs.  Allergies  Allergen Reactions  . Penicillins     Family History  Problem Relation Age of Onset  . Cancer Mother         Breast  . Breast cancer Mother   . Mental illness Father   . Mental illness Paternal Grandmother     Prior to Admission medications   Medication Sig Start Date End Date Taking? Authorizing Provider  CALCIUM PO Take 1 tablet by mouth daily.   Yes [provider]  Multiple Vitamin (MULTIVITAMIN ADULT PO) Take 1 tablet by mouth daily.   Yes [provider]  ondansetron (ZOFRAN-ODT) 4 MG disintegrating tablet Take 4 mg by mouth every 8 (eight) hours as needed for nausea or vomiting.  01/10/20  Yes [provider]  TURMERIC PO Take 1 tablet by mouth daily.   Yes [provider]    Physical Exam: Vitals:   01/11/20 1130 01/11/20 1145 01/11/20 1230 01/11/20 1330  BP: 121/76 105/61 119/80 119/76  Pulse: 84 83 (!) 110 100  Resp: (!) 29 (!) 21 (!) 28 (!) 23  Temp:      TempSrc:      SpO2: 98% 99% 91% 99%  Weight:      Height:        Constitutional: NAD, calm, comfortable Vitals:   01/11/20 1130 01/11/20 1145 01/11/20 1230 01/11/20 1330  BP: 121/76 105/61 119/80 119/76  Pulse: 84 83 (!) 110 100  Resp: (!) 29 (!) 21 (!) 28 (!) 23  Temp:      TempSrc:      SpO2: 98% 99%  91% 99%  Weight:      Height:       Eyes: PERRL, lids and conjunctivae normal ENMT: Mucous membranes are dry. Posterior pharynx clear of any exudate or lesions.Normal dentition.  Neck: normal, supple, no masses, no thyromegaly Respiratory: Diffused coarse crackles bilaterally with scattered wheezing.  Significant increase of respiratory effort. No accessory muscle use.  Cardiovascular: Regular rate and rhythm, no murmurs / rubs / gallops. No extremity edema. 2+ pedal pulses. No carotid bruits.  Abdomen: no tenderness, no masses palpated. No hepatosplenomegaly. Bowel sounds positive.  Musculoskeletal: no clubbing / cyanosis. No joint deformity upper and lower extremities. Good ROM, no contractures. Normal muscle tone.  Skin: no rashes, lesions, ulcers. No induration Neurologic: CN  2-12 grossly intact. Sensation intact, DTR normal. Strength 5/5 in all 4.  Psychiatric: Normal judgment and insight. Alert and oriented x 3. Normal mood.     Labs on Admission: I have personally reviewed following labs and imaging studies  CBC: Recent Labs  Lab 01/11/20 0832 01/11/20 1044  WBC 2.1* 1.9*  NEUTROABS  --  1.5*  HGB 12.7 12.8  HCT 37.0 36.8  MCV 91.8 92.0  PLT 65* 63*   Basic Metabolic Panel: Recent Labs  Lab 01/11/20 0832  NA 120*  K 3.9  CL 89*  CO2 24  GLUCOSE 119*  BUN 8  CREATININE 0.62  CALCIUM 7.8*   GFR: Estimated Creatinine Clearance: 48 mL/min (by C-G formula based on SCr of 0.62 mg/dL). Liver Function Tests: Recent Labs  Lab 01/11/20 0832  AST 908*  ALT 349*  ALKPHOS 489*  BILITOT 0.8  PROT 5.9*  ALBUMIN 3.1*   Recent Labs  Lab 01/11/20 0832  LIPASE 41   No results for input(s): AMMONIA in the last 168 hours. Coagulation Profile: Recent Labs  Lab 01/11/20 1044  INR 1.0   Cardiac Enzymes: Recent Labs  Lab 01/11/20 1253  CKTOTAL 167   BNP (last 3 results) No results for input(s): PROBNP in the last 8760 hours. HbA1C: No results for input(s): HGBA1C in the last 72 hours. CBG: No results for input(s): GLUCAP in the last 168 hours. Lipid Profile: No results for input(s): CHOL, HDL, LDLCALC, TRIG, CHOLHDL, LDLDIRECT in the last 72 hours. Thyroid Function Tests: No results for input(s): TSH, T4TOTAL, FREET4, T3FREE, THYROIDAB in the last 72 hours. Anemia Panel: No results for input(s): VITAMINB12, FOLATE, FERRITIN, TIBC, IRON, RETICCTPCT in the last 72 hours. Urine analysis:    Component Value Date/Time   COLORURINE STRAW (A) 01/11/2020 1055   APPEARANCEUR CLEAR 01/11/2020 1055   LABSPEC 1.002 (L) 01/11/2020 1055   PHURINE 7.0 01/11/2020 1055   GLUCOSEU NEGATIVE 01/11/2020 1055   HGBUR NEGATIVE 01/11/2020 1055   BILIRUBINUR NEGATIVE 01/11/2020 1055   KETONESUR NEGATIVE 01/11/2020 1055   PROTEINUR NEGATIVE  01/11/2020 1055   NITRITE NEGATIVE 01/11/2020 1055   LEUKOCYTESUR NEGATIVE 01/11/2020 1055    Radiological Exams on Admission: DG Chest 2 View  Result Date: 01/11/2020 CLINICAL DATA:  Cough, nausea, headache, recent shingles vaccine. EXAM: CHEST - 2 VIEW COMPARISON:  09/14/2013 chest radiograph. FINDINGS: Stable cardiomediastinal silhouette with normal heart size. No pneumothorax. No pleural effusion. New diffuse streaky parahilar opacities throughout both lungs. IMPRESSION: New diffuse streaky parahilar opacities throughout both lungs, which could represent atypical pneumonia or pulmonary edema. Recommend follow-up chest radiographs. Electronically Signed   By: Ilona Sorrel M.D.   On: 01/11/2020 08:45   US Abdomen Limited RUQ  Result Date: 01/11/2020 CLINICAL DATA:  Nausea and  right upper quadrant pain since Thursday. EXAM: ULTRASOUND ABDOMEN LIMITED RIGHT UPPER QUADRANT COMPARISON:  None. FINDINGS: Gallbladder: Sludge. No gallstones. Asymmetric wall thickening and edema measuring up to 9 mm. No sonographic Murphy sign noted by sonographer. Common bile duct: Diameter: 5 mm, normal. Liver: 7 x 9 x 8 mm echogenic lesion in the right. Within normal limits in parenchymal echogenicity. Portal vein is patent on color Doppler imaging with normal direction of blood flow towards the liver. Other: Small perihepatic ascites.  Small right pleural effusion. IMPRESSION: 1. Gallbladder sludge with asymmetric wall thickening and edema. Given small perihepatic ascites, small right pleural effusion, and elevated LFTs, the gallbladder appearance is favored related to underlying liver disease, although acalculus cholecystitis is not entirely excluded. 2. Small 0.9 cm hyperechoic lesion in the right lobe of the liver, probably a hemangioma, benign. If definitive characterization is required, consider follow-up MRI abdomen without and with MultiHance in 6 months. Electronically Signed   By: Titus Dubin M.D.   On:  01/11/2020 12:42    EKG: Independently reviewed.  Sinus tachycardia  Assessment/Plan Active Problems:   Community acquired pneumonia   Pneumonia  Acute hypoxic respite failure secondary to bilateral interstitial/atypical pneumonia -Given other features such as hyponatremia, leukopenia and thrombocytopenia and transaminitis, suspect atypical pneumonia especially Legionella.  Discussed with patient husband, patient has staying at home, no recent history of traveling or staying in the hotel. -Urine legionella Ag, Mycoplasm,  -Respiratory panel to rule out viral PNA other than COVID -She was vaccinated in March for COVID-19 -Continue ceftriaxone and Zithromax regimen -Breathing treatment and short course of p.o. steroid -Echo and Trop to rule out ACS and CHF.  Hyponatremia, hypovolumic -Suspect SIADH from PNA -Serum and urine osmolarity, urine sodium, slow hydration to correct volume status first and then probably fluid restriction from tomorrow.  Transaminitis -RUQ ultrasound showed no acute findings other than gall bladder thickening -Hepatitis panel  Leukopenia and thrombocytopenia -Probably related to PNA, H/H WNL and no S/S of bleeding -[employer] DVT prophylaxis -Monitor CBC trend.  Liver cyst -Repeat liver ultrasound in 6 months    DVT prophylaxis: SCD Code Status: Full Family Communication: Family at bedside Disposition Plan: Pt symptom moderate to severe, likely will need more than 2 midnight hospital stay Consults called: None Admission status: Tele admit   Lequita Halt MD [facility] Pager [phone]    01/11/2020, 2:15 PM

## 2020-01-11 NOTE — Consult Note (Signed)
Name: Kristina Myers MRN: [id number] DOB: [birth year]    ADMISSION DATE:  01/11/2020 CONSULTATION DATE:  01/11/2020   REFERRING MD :  Evert Kohl MD  CHIEF COMPLAINT: Cough and dyspnea  BRIEF PATIENT DESCRIPTION: 74 year old never smoker admitted with atypical pneumonia, hypoxia, elevated LFTs and hyponatremia  SIGNIFICANT EVENTS    STUDIES:  CT angiogram chest 6/7  >> Upper lobe predominant smooth interlobular septal thickening with patchy and confluent peribronchovascular ground-glass densities, concerning for pulmonary edema, less likely atypical infection. 3. Small to moderate bilateral pleural effusions. 4. Small perihepatic ascites.  Ultrasound liver 6/7 >> Gallbladder sludge with asymmetric wall thickening and edema.  No CBD dilatation   HISTORY OF PRESENT ILLNESS: 74 year old never smoker presented with 2 days of dry cough and shortness of breath for 1 day.  She received a Shingrix vaccine 1 week ago, this was followed by headache and chills for 2 days, she developed vomiting and a dry cough, she was evaluated in urgent care and was found to have saturation of 88% and was advised to head to the emergency room.  She had received Covid vaccine, last shot 3/12.  In the ED she was noted to have saturation of 90% on room air, chest x-ray personally reviewed which shows bilateral patchy infiltrates Labs showed leukopenia with WBC count of 2.1 and transaminitis with thrombocytopenia, Covid testing was negative   PAST MEDICAL HISTORY :   has a past medical history of Arthritis.  has a past surgical history that includes Cesarean section and Tubal ligation. Prior to Admission medications   Medication Sig Start Date End Date Taking? Authorizing Provider  CALCIUM PO Take 1 tablet by mouth daily.   Yes [provider]  Multiple Vitamin (MULTIVITAMIN ADULT PO) Take 1 tablet by mouth daily.   Yes [provider]  ondansetron (ZOFRAN-ODT) 4 MG disintegrating  tablet Take 4 mg by mouth every 8 (eight) hours as needed for nausea or vomiting.  01/10/20  Yes [provider]  TURMERIC PO Take 1 tablet by mouth daily.   Yes [provider]   Allergies  Allergen Reactions  . Penicillins     FAMILY HISTORY:  family history includes Breast cancer in her mother; Cancer in her mother; Mental illness in her father and paternal grandmother. SOCIAL HISTORY:  reports that she has never smoked. She has never used smokeless tobacco. She reports current alcohol use of about 6.0 standard drinks of alcohol per week. She reports that she does not use drugs.  REVIEW OF SYSTEMS:   Constitutional: Positive for fever, chills, negative for weight loss, malaise/fatigue and diaphoresis.  HENT: Negative for hearing loss, ear pain, nosebleeds, congestion, sore throat, neck pain, tinnitus and ear discharge.   Eyes: Negative for blurred vision, double vision, photophobia, pain, discharge and redness.  Respiratory: Negative for  hemoptysis, sputum production, wheezing and stridor.   Cardiovascular: Negative for chest pain, palpitations, orthopnea, claudication, leg swelling and PND.  Gastrointestinal: Negative for heartburn, nausea, vomiting, abdominal pain, diarrhea, constipation, blood in stool and melena.  Genitourinary: Negative for dysuria, urgency, frequency, hematuria and flank pain.  Musculoskeletal: Negative for myalgias, back pain, joint pain and falls.  Skin: Negative for itching and rash.  Neurological: Negative for dizziness, tingling, tremors, sensory change, speech change, focal weakness, seizures, loss of consciousness, weakness and headaches.  Endo/Heme/Allergies: Negative for environmental allergies and polydipsia. Does not bruise/bleed easily.  SUBJECTIVE:   VITAL SIGNS: Temp:  [98.6 F (37 C)] 98.6 F (37 C) (06/07 0816)  Pulse Rate:  [80-110] 97 (06/07 1445) Resp:  [16-29] 24 (06/07 1445) BP: (104-122)/(61-93) 105/70 (06/07  1445) SpO2:  [89 %-100 %] 100 % (06/07 1445) Weight:  [48.5 kg] 48.5 kg (06/07 0822)  PHYSICAL EXAMINATION: Gen. Pleasant, well-nourished, in no distress, normal affect ENT - no pallor,icterus, no post nasal drip, on 10 L nasal cannula Neck: No JVD, no thyromegaly, no carotid bruits Resp -no accessory muscle use, able to speak in full sentences bilateral diffuse scattered crackles Cardiovascular: Rhythm regular, heart sounds  normal, no murmurs or gallops, no peripheral edema Abdomen: soft and non-tender, no hepatosplenomegaly, BS normal. Musculoskeletal: No deformities, no cyanosis or clubbing Neuro:  alert, non focal   Recent Labs  Lab 01/11/20 0832  NA 120*  K 3.9  CL 89*  CO2 24  BUN 8  CREATININE 0.62  GLUCOSE 119*   Recent Labs  Lab 01/11/20 0832 01/11/20 1044  HGB 12.7 12.8  HCT 37.0 36.8  WBC 2.1* 1.9*  PLT 65* 63*   DG Chest 2 View  Result Date: 01/11/2020 CLINICAL DATA:  Cough, nausea, headache, recent shingles vaccine. EXAM: CHEST - 2 VIEW COMPARISON:  09/14/2013 chest radiograph. FINDINGS: Stable cardiomediastinal silhouette with normal heart size. No pneumothorax. No pleural effusion. New diffuse streaky parahilar opacities throughout both lungs. IMPRESSION: New diffuse streaky parahilar opacities throughout both lungs, which could represent atypical pneumonia or pulmonary edema. Recommend follow-up chest radiographs. Electronically Signed   By: Ilona Sorrel M.D.   On: 01/11/2020 08:45   CT Angio Chest PE W and/or Wo Contrast  Result Date: 01/11/2020 CLINICAL DATA:  Cough, chills, and vomiting. EXAM: CT ANGIOGRAPHY CHEST WITH CONTRAST TECHNIQUE: Multidetector CT imaging of the chest was performed using the standard protocol during bolus administration of intravenous contrast. Multiplanar CT image reconstructions and MIPs were obtained to evaluate the vascular anatomy. CONTRAST:  2m OMNIPAQUE IOHEXOL 350 MG/ML SOLN COMPARISON:  Chest x-ray from same day. FINDINGS:  Cardiovascular: Satisfactory opacification of the pulmonary arteries to the segmental level. No evidence of pulmonary embolism. Normal heart size. No pericardial effusion. No thoracic aortic aneurysm or dissection. Mediastinum/Nodes: No enlarged mediastinal, hilar, or axillary lymph nodes. Tiny subcentimeter hypodense nodules and calcifications in the thyroid gland. Not clinically significant; no follow-up imaging recommended. Trachea and esophagus demonstrate no significant findings. Lungs/Pleura: Upper lobe predominant smooth interlobular septal thickening. Upper lobe predominant patchy and confluent peribronchovascular ground-glass densities. Dependent atelectasis in both lower lobes. Small to moderate bilateral pleural effusions. No pneumothorax. Upper Abdomen: Small perihepatic ascites. Air anterior to the liver within largely decompressed transverse colon. Musculoskeletal: No chest wall abnormality. No acute or significant osseous findings. Review of the MIP images confirms the above findings. IMPRESSION: 1. No evidence of pulmonary embolism. 2. Upper lobe predominant smooth interlobular septal thickening with patchy and confluent peribronchovascular ground-glass densities, concerning for pulmonary edema, less likely atypical infection. 3. Small to moderate bilateral pleural effusions. 4. Small perihepatic ascites. Electronically Signed   By: WTitus DubinM.D.   On: 01/11/2020 15:18   ECHOCARDIOGRAM COMPLETE  Result Date: 01/11/2020    ECHOCARDIOGRAM REPORT   Patient Name:   CJENEA DAKEDate of Exam: 01/11/2020 Medical Rec #:  [id number]                Height:       62.0 in Accession #:    [id number]               Weight:       107.0  lb Date of Birth:  [birth year]                BSA:          1.465 m Patient Age:    55 years                  BP:           105/70 mmHg Patient Gender: F                         HR:           96 bpm. Exam Location:  Inpatient Procedure: 2D Echo Indications:     Dyspnea 786.09 / R06.00  History:        Patient has no prior history of Echocardiogram examinations.                 Risk Factors:Non-Smoker. Pneumonia.  Sonographer:    Leavy Cella Referring Phys: [id number] [place]  1. Left ventricular ejection fraction, by estimation, is 60 to 65%. The left ventricle has normal function. The left ventricle has no regional wall motion abnormalities. Left ventricular diastolic parameters are consistent with Grade I diastolic dysfunction (impaired relaxation).  2. Right ventricular systolic function is normal. The right ventricular size is normal.  3. The mitral valve is grossly normal. Trivial mitral valve regurgitation.  4. The aortic valve is tricuspid. Aortic valve regurgitation is not visualized.  5. The inferior vena cava is dilated in size with >50% respiratory variability, suggesting right atrial pressure of 8 mmHg. FINDINGS  Left Ventricle: Left ventricular ejection fraction, by estimation, is 60 to 65%. The left ventricle has normal function. The left ventricle has no regional wall motion abnormalities. The left ventricular internal cavity size was normal in size. There is  no left ventricular hypertrophy. Left ventricular diastolic parameters are consistent with Grade I diastolic dysfunction (impaired relaxation). Indeterminate filling pressures. Right Ventricle: The right ventricular size is normal. No increase in right ventricular wall thickness. Right ventricular systolic function is normal. Left Atrium: Left atrial size was normal in size. Right Atrium: Right atrial size was normal in size. Pericardium: There is no evidence of pericardial effusion. Mitral Valve: The mitral valve is grossly normal. Trivial mitral valve regurgitation. Tricuspid Valve: The tricuspid valve is grossly normal. Tricuspid valve regurgitation is trivial. Aortic Valve: The aortic valve is tricuspid. Aortic valve regurgitation is not visualized. Pulmonic Valve: The pulmonic  valve was normal in structure. Pulmonic valve regurgitation is not visualized. Aorta: The aortic root and ascending aorta are structurally normal, with no evidence of dilitation. Venous: The inferior vena cava is dilated in size with greater than 50% respiratory variability, suggesting right atrial pressure of 8 mmHg. IAS/Shunts: No atrial level shunt detected by color flow Doppler. Additional Comments: There is a small pleural effusion in the right lateral region. Mild ascites is present.  LEFT VENTRICLE PLAX 2D LVIDd:         4.80 cm  Diastology LVIDs:         3.40 cm  LV e' lateral:   8.05 cm/s LV PW:         0.80 cm  LV E/e' lateral: 10.2 LV IVS:        0.90 cm  LV e' medial:    8.16 cm/s LVOT diam:     1.90 cm  LV E/e' medial:  10.0 LVOT Area:     2.84 cm  RIGHT VENTRICLE RV S prime:     21.30 cm/s TAPSE (M-mode): 2.2 cm LEFT ATRIUM           Index LA diam:      2.40 cm 1.64 cm/m LA Vol (A2C): 49.8 ml 33.98 ml/m LA Vol (A4C): 39.2 ml 26.75 ml/m   AORTA Ao Root diam: 3.00 cm MITRAL VALVE MV Area (PHT): 5.27 cm    SHUNTS MV Decel Time: 144 msec    Systemic Diam: 1.90 cm MV E velocity: 82.00 cm/s MV A velocity: 42.70 cm/s MV E/A ratio:  1.92 Lyman Bishop MD Electronically signed by Lyman Bishop MD Signature Date/Time: 01/11/2020/4:49:56 PM    Final    US Abdomen Limited RUQ  Result Date: 01/11/2020 CLINICAL DATA:  Nausea and right upper quadrant pain since Thursday. EXAM: ULTRASOUND ABDOMEN LIMITED RIGHT UPPER QUADRANT COMPARISON:  None. FINDINGS: Gallbladder: Sludge. No gallstones. Asymmetric wall thickening and edema measuring up to 9 mm. No sonographic Murphy sign noted by sonographer. Common bile duct: Diameter: 5 mm, normal. Liver: 7 x 9 x 8 mm echogenic lesion in the right. Within normal limits in parenchymal echogenicity. Portal vein is patent on color Doppler imaging with normal direction of blood flow towards the liver. Other: Small perihepatic ascites.  Small right pleural effusion. IMPRESSION:  1. Gallbladder sludge with asymmetric wall thickening and edema. Given small perihepatic ascites, small right pleural effusion, and elevated LFTs, the gallbladder appearance is favored related to underlying liver disease, although acalculus cholecystitis is not entirely excluded. 2. Small 0.9 cm hyperechoic lesion in the right lobe of the liver, probably a hemangioma, benign. If definitive characterization is required, consider follow-up MRI abdomen without and with MultiHance in 6 months. Electronically Signed   By: Titus Dubin M.D.   On: 01/11/2020 12:42    ASSESSMENT / PLAN:  Acute hypoxic respiratory failure Presentation of atypical pneumonia with hyponatremia and elevated LFTs raises the question of Legionella pneumonia. Covid testing is negative as is influenza panel and RSV Hyponatremia -urine appears to be appropriately dilute with low  osmolality and low urine sodium Elevated LFTs with high alk phos suggest cholestatic picture Leukopenia and thrombocytopenia likely due to bone marrow suppression  Recommend -Agree with current antibiotics, if urine Legionella is positive then would convert to levofloxacin as single agent -Check Tylenol level for completion since she admits to using a lot of Tylenol over the past few days , use heating pad for back pain rather than Tylenol   PCCM to follow progress  Kara Mead MD. [facility]ade Flood. [facility]  If no response to pager , please call [phone]   01/11/2020, 6:13 PM

## 2020-01-11 NOTE — Progress Notes (Signed)
*  PRELIMINARY RESULTS* Echocardiogram 2D Echocardiogram has been performed.  Leavy Cella 01/11/2020, 3:42 PM

## 2020-01-11 NOTE — ED Triage Notes (Addendum)
Patient arrives to ED with complaints of nausea and headache since Thursday that has continued. Patient states that she got the shingles vaccine last Tuesday. Patient reports chills, cough, and emesis over the weekend.

## 2020-01-11 NOTE — ED Notes (Signed)
This RN paged the admitting Provider regarding this pts elevated Troponin; no further orders at this time.

## 2020-01-11 NOTE — ED Notes (Signed)
Pt transported to ultrasound.

## 2020-01-12 ENCOUNTER — Inpatient Hospital Stay (HOSPITAL_COMMUNITY): Payer: Medicare Other

## 2020-01-12 DIAGNOSIS — R7401 Elevation of levels of liver transaminase levels: Secondary | ICD-10-CM

## 2020-01-12 LAB — MYCOPLASMA PNEUMONIAE ANTIBODY, IGM: Mycoplasma pneumo IgM: <770 U/mL (ref 0–769)

## 2020-01-12 LAB — STREP PNEUMONIAE URINARY ANTIGEN: Strep Pneumo Urinary Antigen: NEGATIVE

## 2020-01-12 LAB — COMPREHENSIVE METABOLIC PANEL
ALT: 319 U/L — ABNORMAL HIGH (ref 0–44)
AST: 751 U/L — ABNORMAL HIGH (ref 15–41)
Albumin: 2.8 g/dL — ABNORMAL LOW (ref 3.5–5.0)
Alkaline Phosphatase: 644 U/L — ABNORMAL HIGH (ref 38–126)
Anion gap: 12 (ref 5–15)
BUN: 9 mg/dL (ref 8–23)
CO2: 22 mmol/L (ref 22–32)
Calcium: 7.6 mg/dL — ABNORMAL LOW (ref 8.9–10.3)
Chloride: 95 mmol/L — ABNORMAL LOW (ref 98–111)
Creatinine, Ser: 0.66 mg/dL (ref 0.44–1.00)
GFR calc Af Amer: >60 mL/min (ref >60)
GFR calc non Af Amer: >60 mL/min (ref >60)
Glucose, Bld: 135 mg/dL — ABNORMAL HIGH (ref 70–99)
Potassium: 3.4 mmol/L — ABNORMAL LOW (ref 3.5–5.1)
Sodium: 129 mmol/L — ABNORMAL LOW (ref 135–145)
Total Bilirubin: 0.9 mg/dL (ref 0.3–1.2)
Total Protein: 5.6 g/dL — ABNORMAL LOW (ref 6.5–8.1)

## 2020-01-12 LAB — RESPIRATORY PANEL BY PCR

## 2020-01-12 LAB — CBC
HCT: 40.3 % (ref 36.0–46.0)
Hemoglobin: 13.7 g/dL (ref 12.0–15.0)
MCH: 31.5 pg (ref 26.0–34.0)
MCHC: 34 g/dL (ref 30.0–36.0)
MCV: 92.6 fL (ref 80.0–100.0)
Platelets: 75 10*3/uL — ABNORMAL LOW (ref 150–400)
RBC: 4.35 MIL/uL (ref 3.87–5.11)
RDW: 12.2 % (ref 11.5–15.5)
WBC: 2.9 10*3/uL — ABNORMAL LOW (ref 4.0–10.5)
nRBC: 0 % (ref 0.0–0.2)

## 2020-01-12 LAB — LIPID PANEL
Cholesterol: 120 mg/dL (ref 0–200)
HDL: 36 mg/dL — ABNORMAL LOW (ref >40)
LDL Cholesterol: 55 mg/dL (ref 0–99)
Total CHOL/HDL Ratio: 3.3 RATIO
Triglycerides: 143 mg/dL (ref <150)
VLDL: 29 mg/dL (ref 0–40)

## 2020-01-12 LAB — PROCALCITONIN
Procalcitonin: 2.02 ng/mL
Procalcitonin: 2.38 ng/mL

## 2020-01-12 LAB — TSH: TSH: 0.37 u[IU]/mL (ref 0.350–4.500)

## 2020-01-12 LAB — MRSA PCR SCREENING: MRSA by PCR: NEGATIVE

## 2020-01-12 LAB — LEGIONELLA PNEUMOPHILA SEROGP 1 UR AG: L. pneumophila Serogp 1 Ur Ag: NEGATIVE

## 2020-01-12 LAB — TROPONIN I (HIGH SENSITIVITY): Troponin I (High Sensitivity): 971 ng/L (ref <18)

## 2020-01-12 LAB — ACETAMINOPHEN LEVEL: Acetaminophen (Tylenol), Serum: <10 ug/mL — ABNORMAL LOW (ref 10–30)

## 2020-01-12 MED ORDER — METOPROLOL TARTRATE 5 MG/5ML IV SOLN
5.0000 mg | INTRAVENOUS | Status: AC | PRN
  Administered 2020-01-12: 5 mg via INTRAVENOUS
  Filled 2020-01-12: qty 5

## 2020-01-12 MED ORDER — POTASSIUM CHLORIDE CRYS ER 20 MEQ PO TBCR
20.0000 meq | EXTENDED_RELEASE_TABLET | Freq: Once | ORAL | Status: AC
  Administered 2020-01-12: 20 meq via ORAL
  Filled 2020-01-12: qty 1

## 2020-01-12 MED ORDER — FUROSEMIDE 10 MG/ML IJ SOLN
40.0000 mg | Freq: Once | INTRAMUSCULAR | Status: AC
  Administered 2020-01-12: 40 mg via INTRAVENOUS
  Filled 2020-01-12: qty 4

## 2020-01-12 MED ORDER — METOPROLOL TARTRATE 5 MG/5ML IV SOLN
5.0000 mg | INTRAVENOUS | Status: AC | PRN
  Administered 2020-01-12: 5 mg via INTRAVENOUS
  Filled 2020-01-12 (×2): qty 5

## 2020-01-12 MED ORDER — POTASSIUM CHLORIDE CRYS ER 20 MEQ PO TBCR
40.0000 meq | EXTENDED_RELEASE_TABLET | Freq: Once | ORAL | Status: AC
  Administered 2020-01-12: 40 meq via ORAL
  Filled 2020-01-12: qty 2

## 2020-01-12 MED ORDER — SODIUM CHLORIDE 0.9 % IV SOLN
100.0000 mg | Freq: Two times a day (BID) | INTRAVENOUS | Status: DC
  Administered 2020-01-12 – 2020-01-15 (×7): 100 mg via INTRAVENOUS
  Filled 2020-01-12 (×8): qty 100

## 2020-01-12 MED ORDER — IPRATROPIUM-ALBUTEROL 0.5-2.5 (3) MG/3ML IN SOLN
3.0000 mL | Freq: Four times a day (QID) | RESPIRATORY_TRACT | Status: DC
  Administered 2020-01-13 – 2020-01-14 (×5): 3 mL via RESPIRATORY_TRACT
  Filled 2020-01-12 (×5): qty 3

## 2020-01-12 NOTE — ED Notes (Signed)
Pt received breakfast tray 

## 2020-01-12 NOTE — ED Notes (Signed)
Pt acutely had an elevation in HR from 100s to 148-152. Provider made aware of situation. BP Stable and Pt currently asleep.

## 2020-01-12 NOTE — Consult Note (Signed)
[facility]       Reason for Consult: pneumonia    Referring Physician: Dr. Florene Glen  Active Problems:   Community acquired pneumonia   Pneumonia   . aspirin EC  81 mg Oral Daily  . benzonatate  200 mg Oral TID  . budesonide (PULMICORT) nebulizer solution  0.25 mg Nebulization BID  . calcium carbonate  1 tablet Oral Q breakfast  . ipratropium-albuterol  3 mL Nebulization Q6H  . multivitamin with minerals  1 tablet Oral Daily  . potassium chloride  20 mEq Oral Once  . potassium chloride  40 mEq Oral Once    Recommendations: Doxycycline ceftriaxone  Stop azithromycin  Assessment: She has acute findings of headache, transaminitis, leukopenia, thrombocytopenia, hyponatremia with pulmonary edema vs infection, recent tick bites, concerning most for RMSF.  Other tick borne infections less likely.  With RMSF, pulmonary edema/ARDS of concern.    Antibiotics: Azithromycin, ceftriaxone, starting doxycycline  HPI: Kristina Myers is a 74 y.o. female with no significant pmh with acute new onset of cough, sob, headache.  No known fever.  Recent tick bites about 1 week ago.  Poor po, nausea.  + chills.  Labs as above.  No known sick contacts.  She is hypoxic.  CXR and CT with pulmonary edema.     Review of Systems:  Constitutional: positive for chills, fatigue and anorexia or negative for fevers Gastrointestinal: positive for nausea, negative for diarrhea Integument/breast: negative for rash All other systems reviewed and are negative    Past Medical History:  Diagnosis Date  . Arthritis     Social History   Tobacco Use  . Smoking status: Never Smoker  . Smokeless tobacco: Never Used  Substance Use Topics  . Alcohol use: Yes    Alcohol/week: 6.0 standard drinks    Types: 6 Standard drinks or equivalent per week  . Drug use: No    Family History  Problem Relation Age of Onset  . Cancer Mother        Breast  . Breast cancer Mother    . Mental illness Father   . Mental illness Paternal Grandmother     Allergies  Allergen Reactions  . Penicillins     Physical Exam: Constitutional: ill appearing Vitals:   01/12/20 0851 01/12/20 0940  BP:  100/71  Pulse: (!) 132 (!) 131  Resp: (!) 34 (!) 26  Temp:    SpO2: (!) 89% 92%   EYES: anicteric Cardiovascular: Cor Tachy Respiratory: bilateral crackles about half up;  Increased respiratory effort GI: Bowel sounds are normal, liver is not enlarged, spleen is not enlarged Musculoskeletal: no pedal edema noted Skin: negatives: no rash Neuro: non-focal  Lab Results  Component Value Date   WBC 2.9 (L) 01/12/2020   HGB 13.7 01/12/2020   HCT 40.3 01/12/2020   MCV 92.6 01/12/2020   PLT 75 (L) 01/12/2020    Lab Results  Component Value Date   CREATININE 0.66 01/12/2020   BUN 9 01/12/2020   NA 129 (L) 01/12/2020   K 3.4 (L) 01/12/2020   CL 95 (L) 01/12/2020   CO2 22 01/12/2020    Lab Results  Component Value Date   ALT 319 (H) 01/12/2020   AST 751 (H) 01/12/2020   ALKPHOS 644 (H) 01/12/2020     Microbiology: Recent Results (from the past 240 hour(s))  SARS Coronavirus 2 by RT PCR (hospital order, performed in [facility] hospital lab) Nasopharyngeal Nasopharyngeal Swab  Status: None   Collection Time: 01/11/20 10:21 AM   Specimen: Nasopharyngeal Swab  Result Value Ref Range Status   SARS Coronavirus 2 NEGATIVE NEGATIVE Final    Comment: (NOTE) SARS-CoV-2 target nucleic acids are NOT DETECTED. The SARS-CoV-2 RNA is generally detectable in upper and lower respiratory specimens during the acute phase of infection. The lowest concentration of SARS-CoV-2 viral copies this assay can detect is 250 copies / mL. A negative result does not preclude SARS-CoV-2 infection and should not be used as the sole basis for treatment or other patient management decisions.  A negative result may occur with improper specimen collection / handling, submission of specimen  other than nasopharyngeal swab, presence of viral mutation(s) within the areas targeted by this assay, and inadequate number of viral copies (<250 copies / mL). A negative result must be combined with clinical observations, patient history, and epidemiological information. Fact Sheet for Patients:   StrictlyIdeas.no Fact Sheet for Healthcare Providers: BankingDealers.co.za This test is not yet approved or cleared  by the [place] FDA and has been authorized for detection and/or diagnosis of SARS-CoV-2 by FDA under an Emergency Use Authorization (EUA).  This EUA will remain in effect (meaning this test can be used) for the duration of the COVID-19 declaration under Section 564(b)(1) of the Act, 21 U.S.C. section 360bbb-3(b)(1), unless the authorization is terminated or revoked sooner. Performed at [facility], [place] [address], [place], [place] [address]   Resp Panel by RT PCR (RSV, Flu A&B, Covid) - Nasopharyngeal Swab     Status: None   Collection Time: 01/11/20  4:34 PM   Specimen: Nasopharyngeal Swab  Result Value Ref Range Status   SARS Coronavirus 2 by RT PCR NEGATIVE NEGATIVE Final    Comment: (NOTE) SARS-CoV-2 target nucleic acids are NOT DETECTED. The SARS-CoV-2 RNA is generally detectable in upper respiratoy specimens during the acute phase of infection. The lowest concentration of SARS-CoV-2 viral copies this assay can detect is 131 copies/mL. A negative result does not preclude SARS-Cov-2 infection and should not be used as the sole basis for treatment or other patient management decisions. A negative result may occur with  improper specimen collection/handling, submission of specimen other than nasopharyngeal swab, presence of viral mutation(s) within the areas targeted by this assay, and inadequate number of viral copies (<131 copies/mL). A negative result must be combined with clinical observations, patient  history, and epidemiological information. The expected result is Negative. Fact Sheet for Patients:  PinkCheek.be Fact Sheet for Healthcare Providers:  GravelBags.it This test is not yet ap proved or cleared by the [place] FDA and  has been authorized for detection and/or diagnosis of SARS-CoV-2 by FDA under an Emergency Use Authorization (EUA). This EUA will remain  in effect (meaning this test can be used) for the duration of the COVID-19 declaration under Section 564(b)(1) of the Act, 21 U.S.C. section 360bbb-3(b)(1), unless the authorization is terminated or revoked sooner.    Influenza A by PCR NEGATIVE NEGATIVE Final   Influenza B by PCR NEGATIVE NEGATIVE Final    Comment: (NOTE) The Xpert Xpress SARS-CoV-2/FLU/RSV assay is intended as an aid in  the diagnosis of influenza from Nasopharyngeal swab specimens and  should not be used as a sole basis for treatment. Nasal washings and  aspirates are unacceptable for Xpert Xpress SARS-CoV-2/FLU/RSV  testing. Fact Sheet for Patients: PinkCheek.be Fact Sheet for Healthcare Providers: GravelBags.it This test is not yet approved or cleared by the [place] and  has been authorized for detection and/or diagnosis of SARS-CoV-2 by  FDA under an Emergency Use Authorization (EUA). This EUA will remain  in effect (meaning this test can be used) for the duration of the  Covid-19 declaration under Section 564(b)(1) of the Act, 21  U.S.C. section 360bbb-3(b)(1), unless the authorization is  terminated or revoked.    Respiratory Syncytial Virus by PCR NEGATIVE NEGATIVE Final    Comment: (NOTE) Fact Sheet for Patients: PinkCheek.be Fact Sheet for Healthcare Providers: GravelBags.it This test is not yet approved or cleared by the [place] FDA and  has  been authorized for detection and/or diagnosis of SARS-CoV-2 by  FDA under an Emergency Use Authorization (EUA). This EUA will remain  in effect (meaning this test can be used) for the duration of the  COVID-19 declaration under Section 564(b)(1) of the Act, 21 U.S.C.  section 360bbb-3(b)(1), unless the authorization is terminated or  revoked. Performed at [facility], [place] [address], [place], [place] [address]     Eriko Economos W Pancho Rushing, [facility] [email or web address] 01/12/2020, 10:18 AM

## 2020-01-12 NOTE — ED Notes (Signed)
Metoprolol IV given, pt returned to Sinus on the monitor with a HR consistently in 90s. Will continue to monitor.

## 2020-01-12 NOTE — ED Notes (Signed)
Patient placed on 10L humidified high flow O2 via n.c. due to pulmicort breathing tx going.

## 2020-01-12 NOTE — ED Notes (Signed)
Patient's O2 sats remain around 88% on 15L NRB, Duoneb given at this time.

## 2020-01-12 NOTE — ED Notes (Signed)
Patient's HR remains in the 140s after 5mg  of metoprolol, O2 sat low to mid 80s on 15L NRB. Dr. Florene Glen paged to be made aware.

## 2020-01-12 NOTE — ED Notes (Signed)
Assisted pt with bedside commode. 

## 2020-01-12 NOTE — ED Notes (Signed)
Dr. Powell at bedside.  

## 2020-01-12 NOTE — ED Notes (Signed)
Pt was complaining of increased work of breathing. Pt currently on HFNC 10L. Increased to 15L HFNC. Saturations increased slightly and then decreased to 88% again. Pt felt no relief and thus was returned to NRB 10L. This RN will reassess in 15-20 min to assess work of breathing.

## 2020-01-12 NOTE — ED Notes (Addendum)
Patient remains on NRB at 15 L, O2 sats remain around 90-92%.

## 2020-01-12 NOTE — Progress Notes (Addendum)
PROGRESS NOTE    Teegan Guinther Laubacher  EZM:[id number] DOB: Jan 17, 1946 DOA: 01/11/2020 PCP: Kelton Pillar, MD   Chief Complaint  Patient presents with  . Nausea  . Headache    Brief Narrative:  Malayah Demuro is Braylin Xu 74 y.o. female with no significant past medical history presented with new onset of dry cough, and then short of breath.  Patient lives with her husband at home, has been healthy until about 1 week ago.  Patient remembered she had Jaliah Foody shingle vaccination about 1 week ago and then started to have dry cough.  3 days ago she gradually developed nausea, poor appetite, at one time through the weekend.  Her cough also got severe but still remained dry, and she also feels episode of chills.  But no fever.  She also started to have loose bowel movement 2-3 times yesterday.  No abdominal pain.  She also started to feel short of breath from yesterday exertional.  Denies any chest pains.  She woke up today with double headache, denied any vision changes or hearing changes no numbness or weakness of any of the limbs. ED Course: X-ray shows bilateral interstitial infiltrates, WBC 2.1, platelet 65, AST 908 ALT 349.  O2 saturation 98% on room air.  Assessment & Plan:   Active Problems:   Community acquired pneumonia   Pneumonia   Acute hypoxic Respiratory Failure  Concern for Atypical Pneumonia  - Given other features such as hyponatremia, leukopenia and thrombocytopenia and transaminitis, suspect atypical pneumonia especially Legionella.  Discussed with patient husband, patient has staying at home, no recent history of traveling or staying in the hotel. -Urine strep, Urine legionella Ag, Mycoplasm, RVP, sputum cx -negative COVID 19 testing x 2 -Continue ceftriaxone/azithromycin -rapidly worsening oxygen requirement -> initially was satting 90% on RA yesterday -> currently satting in high 80's on 15 L [place] - CXR 6/8 with increasing airspace opacity in mid upper lungs and R lung  base, concerning for edema/infection, bilateral effusions  - Normal BNP, but will trial dose of lasix x1 with imaging findings and worsening resp status - PCCM c/s, will discuss - appreciate cards recs/thoughts regarding possible tick borne illness, sounds like tick exposure Laquiesha Piacente few weeks ago - will add doxy, RMSF serologies and ehrlichia serologies, discuss with infectious disease  Elevated Troponin  Demand Ischemia: suspect related to above as well as SVT.  Troponin increased to 971 this AM, but without chest pain.   Cardiology c/s, appreciate recs Echo with grade 1 diastolic dysfunction, normal EF, no regional WMA Lipid, A1c, risk factor management - consider outpatient stress   Supraventricular Tachycardia: HR in 140's, responded to metoprolol 5 mg x1 last night. Recurrent this AM, will follow response to metoprolol.   Appreciate cardiology assistance  Hyponatremia -Low urine sodium and urine osm - pt did not she was drinking lots of water and unable to tolerate any PO during this illness - improved this AM - will continue to follow  Transaminitis -RUQ ultrasound showed no acute findings other than gall bladder thickening and gallbladder sludge -Hepatitis panel negative - AST/ALT improved this AM, alk phos worsening  Leukopenia and thrombocytopenia -Probably related to infection above, H/H WNL and no S/S of bleeding -[employer] DVT prophylaxis -Monitor CBC trend.  Liver cyst -Repeat liver ultrasound in 6 months  DVT prophylaxis: scd Code Status: full  Family Communication: none at bedside Disposition:   Status is: Inpatient  Remains inpatient appropriate because:Inpatient level of care appropriate due to severity of illness  Dispo: The patient is from: Home              Anticipated d/c is to: Home              Anticipated d/c date is: > 3 days              Patient currently is not medically stable to d/c.   Consultants:    PCCM  Cardiology  ID  Procedures:  Echo IMPRESSIONS    1. Left ventricular ejection fraction, by estimation, is 60 to 65%. The  left ventricle has normal function. The left ventricle has no regional  wall motion abnormalities. Left ventricular diastolic parameters are  consistent with Grade I diastolic  dysfunction (impaired relaxation).  2. Right ventricular systolic function is normal. The right ventricular  size is normal.  3. The mitral valve is grossly normal. Trivial mitral valve  regurgitation.  4. The aortic valve is tricuspid. Aortic valve regurgitation is not  visualized.  5. The inferior vena cava is dilated in size with >50% respiratory  variability, suggesting right atrial pressure of 8 mmHg.   Antimicrobials: Anti-infectives (From admission, onward)   Start     Dose/Rate Route Frequency Ordered Stop   01/12/20 1000  azithromycin (ZITHROMAX) tablet 500 mg     500 mg Oral Daily 01/11/20 1319     01/12/20 1000  cefTRIAXone (ROCEPHIN) 1 g in sodium chloride 0.9 % 100 mL IVPB     1 g 200 mL/hr over 30 Minutes Intravenous Every 24 hours 01/11/20 1319     01/12/20 0915  doxycycline (VIBRAMYCIN) 100 mg in sodium chloride 0.9 % 250 mL IVPB     100 mg 125 mL/hr over 120 Minutes Intravenous Every 12 hours 01/12/20 0909     01/11/20 1015  cefTRIAXone (ROCEPHIN) 1 g in sodium chloride 0.9 % 100 mL IVPB     1 g 200 mL/hr over 30 Minutes Intravenous  Once 01/11/20 1000 01/11/20 1126   01/11/20 1015  azithromycin (ZITHROMAX) 500 mg in sodium chloride 0.9 % 250 mL IVPB     500 mg 250 mL/hr over 60 Minutes Intravenous  Once 01/11/20 1000 01/11/20 1328      Subjective: C/o SOB No chest pain  Objective: Vitals:   01/12/20 0720 01/12/20 0740 01/12/20 0800 01/12/20 0819  BP: 96/63 104/85 104/74   Pulse: (!) 148 (!) 136 (!) 142 (!) 138  Resp: (!) 29 (!) 40 (!) 30   Temp:      TempSrc:      SpO2: (!) 88% (!) 83% (!) 84% 91%  Weight:      Height:         Intake/Output Summary (Last 24 hours) at 01/12/2020 0831 Last data filed at 01/12/2020 4650 Gross per 24 hour  Intake 220 ml  Output --  Net 220 ml   Filed Weights   01/11/20 0822  Weight: 48.5 kg    Examination:  General exam: Appears calm and uncomfortable with resp status Respiratory system: scattered crackles Cardiovascular system: S1 & S2 heard, RRR.  Gastrointestinal system: Abdomen is nondistended, soft and nontender. Central nervous system: Alert and oriented. No focal neurological deficits. Extremities: no LEE Skin: No rashes, lesions or ulcers Psychiatry: Judgement and insight appear normal. Mood & affect appropriate.     Data Reviewed: I have personally reviewed following labs and imaging studies  CBC: Recent Labs  Lab 01/11/20 0832 01/11/20 1044 01/12/20 0649  WBC 2.1* 1.9* 2.9*  NEUTROABS  --  1.5*  --   HGB 12.7 12.8 13.7  HCT 37.0 36.8 40.3  MCV 91.8 92.0 92.6  PLT 65* 63* 75*    Basic Metabolic Panel: Recent Labs  Lab 01/11/20 0832 01/12/20 0649  NA 120* 129*  K 3.9 3.4*  CL 89* 95*  CO2 24 22  GLUCOSE 119* 135*  BUN 8 9  CREATININE 0.62 0.66  CALCIUM 7.8* 7.6*    GFR: Estimated Creatinine Clearance: 48 mL/min (by C-G formula based on SCr of 0.66 mg/dL).  Liver Function Tests: Recent Labs  Lab 01/11/20 0832 01/12/20 0649  AST 908* 751*  ALT 349* 319*  ALKPHOS 489* 644*  BILITOT 0.8 0.9  PROT 5.9* 5.6*  ALBUMIN 3.1* 2.8*    CBG: No results for input(s): GLUCAP in the last 168 hours.   Recent Results (from the past 240 hour(s))  SARS Coronavirus 2 by RT PCR (hospital order, performed in [facility] hospital lab) Nasopharyngeal Nasopharyngeal Swab     Status: None   Collection Time: 01/11/20 10:21 AM   Specimen: Nasopharyngeal Swab  Result Value Ref Range Status   SARS Coronavirus 2 NEGATIVE NEGATIVE Final    Comment: (NOTE) SARS-CoV-2 target nucleic acids are NOT DETECTED. The SARS-CoV-2 RNA is generally detectable in  upper and lower respiratory specimens during the acute phase of infection. The lowest concentration of SARS-CoV-2 viral copies this assay can detect is 250 copies / mL. Alayssa Flinchum negative result does not preclude SARS-CoV-2 infection and should not be used as the sole basis for treatment or other patient management decisions.  Ahmet Schank negative result may occur with improper specimen collection / handling, submission of specimen other than nasopharyngeal swab, presence of viral mutation(s) within the areas targeted by this assay, and inadequate number of viral copies (<250 copies / mL). Rickey Farrier negative result must be combined with clinical observations, patient history, and epidemiological information. Fact Sheet for Patients:   StrictlyIdeas.no Fact Sheet for Healthcare Providers: BankingDealers.co.za This test is not yet approved or cleared  by the [place] FDA and has been authorized for detection and/or diagnosis of SARS-CoV-2 by FDA under an Emergency Use Authorization (EUA).  This EUA will remain in effect (meaning this test can be used) for the duration of the COVID-19 declaration under Section 564(b)(1) of the Act, 21 U.S.C. section 360bbb-3(b)(1), unless the authorization is terminated or revoked sooner. Performed at [facility], [place] [address], [place], [place] [address]   Resp Panel by RT PCR (RSV, Flu Gunnison Chahal&B, Covid) - Nasopharyngeal Swab     Status: None   Collection Time: 01/11/20  4:34 PM   Specimen: Nasopharyngeal Swab  Result Value Ref Range Status   SARS Coronavirus 2 by RT PCR NEGATIVE NEGATIVE Final    Comment: (NOTE) SARS-CoV-2 target nucleic acids are NOT DETECTED. The SARS-CoV-2 RNA is generally detectable in upper respiratoy specimens during the acute phase of infection. The lowest concentration of SARS-CoV-2 viral copies this assay can detect is 131 copies/mL. [place] Dost negative result does not preclude SARS-Cov-2 infection and  should not be used as the sole basis for treatment or other patient management decisions. Enzio Buchler negative result may occur with  improper specimen collection/handling, submission of specimen other than nasopharyngeal swab, presence of viral mutation(s) within the areas targeted by this assay, and inadequate number of viral copies (<131 copies/mL). Chevez Sambrano negative result must be combined with clinical observations, patient history, and epidemiological information. The expected result is Negative. Fact Sheet for Patients:  PinkCheek.be Fact Sheet for Healthcare  Providers:  GravelBags.it This test is not yet ap proved or cleared by the [place] and  has been authorized for detection and/or diagnosis of SARS-CoV-2 by FDA under an Emergency Use Authorization (EUA). This EUA will remain  in effect (meaning this test can be used) for the duration of the COVID-19 declaration under Section 564(b)(1) of the Act, 21 U.S.C. section 360bbb-3(b)(1), unless the authorization is terminated or revoked sooner.    Influenza Dontez Hauss by PCR NEGATIVE NEGATIVE Final   Influenza B by PCR NEGATIVE NEGATIVE Final    Comment: (NOTE) The Xpert Xpress SARS-CoV-2/FLU/RSV assay is intended as an aid in  the diagnosis of influenza from Nasopharyngeal swab specimens and  should not be used as Buster Schueller sole basis for treatment. Nasal washings and  aspirates are unacceptable for Xpert Xpress SARS-CoV-2/FLU/RSV  testing. Fact Sheet for Patients: PinkCheek.be Fact Sheet for Healthcare Providers: GravelBags.it This test is not yet approved or cleared by the [place] FDA and  has been authorized for detection and/or diagnosis of SARS-CoV-2 by  FDA under an Emergency Use Authorization (EUA). This EUA will remain  in effect (meaning this test can be used) for the duration of the  Covid-19 declaration under Section  564(b)(1) of the Act, 21  U.S.C. section 360bbb-3(b)(1), unless the authorization is  terminated or revoked.    Respiratory Syncytial Virus by PCR NEGATIVE NEGATIVE Final    Comment: (NOTE) Fact Sheet for Patients: PinkCheek.be Fact Sheet for Healthcare Providers: GravelBags.it This test is not yet approved or cleared by the [place] FDA and  has been authorized for detection and/or diagnosis of SARS-CoV-2 by  FDA under an Emergency Use Authorization (EUA). This EUA will remain  in effect (meaning this test can be used) for the duration of the  COVID-19 declaration under Section 564(b)(1) of the Act, 21 U.S.C.  section 360bbb-3(b)(1), unless the authorization is terminated or  revoked. Performed at [facility], [place] [address], [place], [place] [address]          Radiology Studies: DG Chest 1 View  Result Date: 01/12/2020 CLINICAL DATA:  Pneumonia, increasing work of breathing EXAM: CHEST  1 VIEW COMPARISON:  CT and radiograph 01/11/2020 FINDINGS: Increasing airspace opacity in the mid to upper lungs and right lung base. Bilateral pleural effusions are again seen. Background of coarse reticular interstitial opacity throughout both lungs. Cardiomediastinal contours are stable. The aorta is calcified. The remaining cardiomediastinal contours are unremarkable. No acute osseous or soft tissue abnormality. Degenerative changes are present in the imaged spine and shoulders. Telemetry leads overlie the chest. IMPRESSION: Increasing airspace opacity in the mid to upper lungs and right lung base which could reflect edema and/or infection. Similar distribution to the recent CT. Bilateral effusions. Aortic Atherosclerosis (ICD10-I70.0). Electronically Signed   By: Lovena Le M.D.   On: 01/12/2020 06:00   DG Chest 2 View  Result Date: 01/11/2020 CLINICAL DATA:  Cough, nausea, headache, recent shingles vaccine. EXAM: CHEST - 2  VIEW COMPARISON:  09/14/2013 chest radiograph. FINDINGS: Stable cardiomediastinal silhouette with normal heart size. No pneumothorax. No pleural effusion. New diffuse streaky parahilar opacities throughout both lungs. IMPRESSION: New diffuse streaky parahilar opacities throughout both lungs, which could represent atypical pneumonia or pulmonary edema. Recommend follow-up chest radiographs. Electronically Signed   By: Ilona Sorrel M.D.   On: 01/11/2020 08:45   CT Angio Chest PE W and/or Wo Contrast  Result Date: 01/11/2020 CLINICAL DATA:  Cough, chills, and vomiting. EXAM: CT ANGIOGRAPHY CHEST  WITH CONTRAST TECHNIQUE: Multidetector CT imaging of the chest was performed using the standard protocol during bolus administration of intravenous contrast. Multiplanar CT image reconstructions and MIPs were obtained to evaluate the vascular anatomy. CONTRAST:  26m OMNIPAQUE IOHEXOL 350 MG/ML SOLN COMPARISON:  Chest x-ray from same day. FINDINGS: Cardiovascular: Satisfactory opacification of the pulmonary arteries to the segmental level. No evidence of pulmonary embolism. Normal heart size. No pericardial effusion. No thoracic aortic aneurysm or dissection. Mediastinum/Nodes: No enlarged mediastinal, hilar, or axillary lymph nodes. Tiny subcentimeter hypodense nodules and calcifications in the thyroid gland. Not clinically significant; no follow-up imaging recommended. Trachea and esophagus demonstrate no significant findings. Lungs/Pleura: Upper lobe predominant smooth interlobular septal thickening. Upper lobe predominant patchy and confluent peribronchovascular ground-glass densities. Dependent atelectasis in both lower lobes. Small to moderate bilateral pleural effusions. No pneumothorax. Upper Abdomen: Small perihepatic ascites. Air anterior to the liver within largely decompressed transverse colon. Musculoskeletal: No chest wall abnormality. No acute or significant osseous findings. Review of the MIP images confirms  the above findings. IMPRESSION: 1. No evidence of pulmonary embolism. 2. Upper lobe predominant smooth interlobular septal thickening with patchy and confluent peribronchovascular ground-glass densities, concerning for pulmonary edema, less likely atypical infection. 3. Small to moderate bilateral pleural effusions. 4. Small perihepatic ascites. Electronically Signed   By: WTitus DubinM.D.   On: 01/11/2020 15:18   ECHOCARDIOGRAM COMPLETE  Result Date: 01/11/2020    ECHOCARDIOGRAM REPORT   Patient Name:   CBetsy PriesDate of Exam: 01/11/2020 Medical Rec #:  [id number]                Height:       62.0 in Accession #:    [id number]               Weight:       107.0 lb Date of Birth:  1Jan 29, 1947               BSA:          1.465 m Patient Age:    89years                  BP:           105/70 mmHg Patient Gender: F                         HR:           96 bpm. Exam Location:  Inpatient Procedure: 2D Echo Indications:    Dyspnea 786.09 / R06.00  History:        Patient has no prior history of Echocardiogram examinations.                 Risk Factors:Non-Smoker. Pneumonia.  Sonographer:    JLeavy CellaReferring Phys: [id number] [place] 1. Left ventricular ejection fraction, by estimation, is 60 to 65%. The left ventricle has normal function. The left ventricle has no regional wall motion abnormalities. Left ventricular diastolic parameters are consistent with Grade I diastolic dysfunction (impaired relaxation).  2. Right ventricular systolic function is normal. The right ventricular size is normal.  3. The mitral valve is grossly normal. Trivial mitral valve regurgitation.  4. The aortic valve is tricuspid. Aortic valve regurgitation is not visualized.  5. The inferior vena cava is dilated in size with >50% respiratory variability, suggesting right atrial pressure of 8 mmHg. FINDINGS  Left Ventricle: Left ventricular ejection  fraction, by estimation, is 60 to 65%. The left  ventricle has normal function. The left ventricle has no regional wall motion abnormalities. The left ventricular internal cavity size was normal in size. There is  no left ventricular hypertrophy. Left ventricular diastolic parameters are consistent with Grade I diastolic dysfunction (impaired relaxation). Indeterminate filling pressures. Right Ventricle: The right ventricular size is normal. No increase in right ventricular wall thickness. Right ventricular systolic function is normal. Left Atrium: Left atrial size was normal in size. Right Atrium: Right atrial size was normal in size. Pericardium: There is no evidence of pericardial effusion. Mitral Valve: The mitral valve is grossly normal. Trivial mitral valve regurgitation. Tricuspid Valve: The tricuspid valve is grossly normal. Tricuspid valve regurgitation is trivial. Aortic Valve: The aortic valve is tricuspid. Aortic valve regurgitation is not visualized. Pulmonic Valve: The pulmonic valve was normal in structure. Pulmonic valve regurgitation is not visualized. Aorta: The aortic root and ascending aorta are structurally normal, with no evidence of dilitation. Venous: The inferior vena cava is dilated in size with greater than 50% respiratory variability, suggesting right atrial pressure of 8 mmHg. IAS/Shunts: No atrial level shunt detected by color flow Doppler. Additional Comments: There is Lilit Cinelli small pleural effusion in the right lateral region. Mild ascites is present.  LEFT VENTRICLE PLAX 2D LVIDd:         4.80 cm  Diastology LVIDs:         3.40 cm  LV e' lateral:   8.05 cm/s LV PW:         0.80 cm  LV E/e' lateral: 10.2 LV IVS:        0.90 cm  LV e' medial:    8.16 cm/s LVOT diam:     1.90 cm  LV E/e' medial:  10.0 LVOT Area:     2.84 cm  RIGHT VENTRICLE RV S prime:     21.30 cm/s TAPSE (M-mode): 2.2 cm LEFT ATRIUM           Index LA diam:      2.40 cm 1.64 cm/m LA Vol (A2C): 49.8 ml 33.98 ml/m LA Vol (A4C): 39.2 ml 26.75 ml/m   AORTA Ao Root diam:  3.00 cm MITRAL VALVE MV Area (PHT): 5.27 cm    SHUNTS MV Decel Time: 144 msec    Systemic Diam: 1.90 cm MV E velocity: 82.00 cm/s MV Loriel Diehl velocity: 42.70 cm/s MV E/Ray Glacken ratio:  1.92 Lyman Bishop MD Electronically signed by Lyman Bishop MD Signature Date/Time: 01/11/2020/4:49:56 PM    Final    US Abdomen Limited RUQ  Result Date: 01/11/2020 CLINICAL DATA:  Nausea and right upper quadrant pain since Thursday. EXAM: ULTRASOUND ABDOMEN LIMITED RIGHT UPPER QUADRANT COMPARISON:  None. FINDINGS: Gallbladder: Sludge. No gallstones. Asymmetric wall thickening and edema measuring up to 9 mm. No sonographic Murphy sign noted by sonographer. Common bile duct: Diameter: 5 mm, normal. Liver: 7 x 9 x 8 mm echogenic lesion in the right. Within normal limits in parenchymal echogenicity. Portal vein is patent on color Doppler imaging with normal direction of blood flow towards the liver. Other: Small perihepatic ascites.  Small right pleural effusion. IMPRESSION: 1. Gallbladder sludge with asymmetric wall thickening and edema. Given small perihepatic ascites, small right pleural effusion, and elevated LFTs, the gallbladder appearance is favored related to underlying liver disease, although acalculus cholecystitis is not entirely excluded. 2. Small 0.9 cm hyperechoic lesion in the right lobe of the liver, probably Ahmyah Gidley hemangioma, benign. If definitive characterization is required, consider  follow-up MRI abdomen without and with MultiHance in 6 months. Electronically Signed   By: Titus Dubin M.D.   On: 01/11/2020 12:42        Scheduled Meds: . aspirin EC  81 mg Oral Daily  . azithromycin  500 mg Oral Daily  . benzonatate  200 mg Oral TID  . budesonide (PULMICORT) nebulizer solution  0.25 mg Nebulization BID  . calcium carbonate  1 tablet Oral Q breakfast  . ipratropium-albuterol  3 mL Nebulization Q6H  . multivitamin with minerals  1 tablet Oral Daily   Continuous Infusions: . cefTRIAXone (ROCEPHIN)  IV       LOS: 1  day    Time spent: over 30 min 45 min critical care time with ahrf with SVT   Fayrene Helper, MD [facility]   To contact the attending provider between 7A-7P or the covering provider during after hours 7P-7A, please log into the web site [email or web address] and access using universal [place] password for that web site. If you do not have the password, please call the hospital operator.  01/12/2020, 8:31 AM

## 2020-01-12 NOTE — Progress Notes (Addendum)
Name: Kristina Myers MRN: [id number] DOB: 02-Jul-1946    ADMISSION DATE:  01/11/2020 CONSULTATION DATE:  01/12/2020   REFERRING MD :  Evert Kohl MD  CHIEF COMPLAINT: Cough and dyspnea  BRIEF PATIENT DESCRIPTION: 74 year old never smoker admitted with atypical pneumonia, hypoxia, elevated LFTs and hyponatremia  SIGNIFICANT EVENTS    STUDIES:  CT angiogram chest 6/7  >> Upper lobe predominant smooth interlobular septal thickening with patchy and confluent peribronchovascular ground-glass densities, concerning for pulmonary edema, less likely atypical infection. 3. Small to moderate bilateral pleural effusions. 4. Small perihepatic ascites.  Ultrasound liver 6/7 >> Gallbladder sludge with asymmetric wall thickening and edema.  No CBD dilatation  2D echo 4098: Grade 1 diastolic dysfunction  HISTORY OF PRESENT ILLNESS: 74 year old never smoker presented with 2 days of dry cough and shortness of breath for 1 day.  She received a Shingrix vaccine 1 week ago, this was followed by headache and chills for 2 days, she developed vomiting and a dry cough, she was evaluated in urgent care and was found to have saturation of 88% and was advised to head to the emergency room.  She had received Covid vaccine, last shot 3/12.  In the ED she was noted to have saturation of 90% on room air, chest x-ray personally reviewed which shows bilateral patchy infiltrates Labs showed leukopenia with WBC count of 2.1 and transaminitis with thrombocytopenia, Covid testing was negative   PAST MEDICAL HISTORY :   has a past medical history of Arthritis.  has a past surgical history that includes Cesarean section and Tubal ligation.   SUBJECTIVE: Dyspneic after rearranging herself in the bed and attempting to eat.  VITAL SIGNS: Pulse Rate:  [80-154] 138 (06/08 0838) Resp:  [16-40] 22 (06/08 0838) BP: (96-132)/(61-93) 104/74 (06/08 0800) SpO2:  [83 %-100 %] 90 % (06/08 0838)  PHYSICAL  EXAMINATION: General: Frail female who appears older than her stated age of 30 HEENT: No JVD or lymphadenopathy is appreciated Neuro: Grossly intact no focal defects CV: Sinus tach 130 PULM: Increased work of breathing positive for dyspnea currently on 10 L nasal cannula sats of 82% GI: soft, bsx4 active  GU: Voids Extremities: warm/dry, negative edema  Skin: no rashes or lesions   Recent Labs  Lab 01/11/20 0832 01/12/20 0649  NA 120* 129*  K 3.9 3.4*  CL 89* 95*  CO2 24 22  BUN 8 9  CREATININE 0.62 0.66  GLUCOSE 119* 135*   Recent Labs  Lab 01/11/20 0832 01/11/20 1044 01/12/20 0649  HGB 12.7 12.8 13.7  HCT 37.0 36.8 40.3  WBC 2.1* 1.9* 2.9*  PLT 65* 63* 75*   DG Chest 1 View  Result Date: 01/12/2020 CLINICAL DATA:  Pneumonia, increasing work of breathing EXAM: CHEST  1 VIEW COMPARISON:  CT and radiograph 01/11/2020 FINDINGS: Increasing airspace opacity in the mid to upper lungs and right lung base. Bilateral pleural effusions are again seen. Background of coarse reticular interstitial opacity throughout both lungs. Cardiomediastinal contours are stable. The aorta is calcified. The remaining cardiomediastinal contours are unremarkable. No acute osseous or soft tissue abnormality. Degenerative changes are present in the imaged spine and shoulders. Telemetry leads overlie the chest. IMPRESSION: Increasing airspace opacity in the mid to upper lungs and right lung base which could reflect edema and/or infection. Similar distribution to the recent CT. Bilateral effusions. Aortic Atherosclerosis (ICD10-I70.0). Electronically Signed   By: Lovena Le M.D.   On: 01/12/2020 06:00   DG Chest 2 View  Result Date:  01/11/2020 CLINICAL DATA:  Cough, nausea, headache, recent shingles vaccine. EXAM: CHEST - 2 VIEW COMPARISON:  09/14/2013 chest radiograph. FINDINGS: Stable cardiomediastinal silhouette with normal heart size. No pneumothorax. No pleural effusion. New diffuse streaky parahilar  opacities throughout both lungs. IMPRESSION: New diffuse streaky parahilar opacities throughout both lungs, which could represent atypical pneumonia or pulmonary edema. Recommend follow-up chest radiographs. Electronically Signed   By: Ilona Sorrel M.D.   On: 01/11/2020 08:45   CT Angio Chest PE W and/or Wo Contrast  Result Date: 01/11/2020 CLINICAL DATA:  Cough, chills, and vomiting. EXAM: CT ANGIOGRAPHY CHEST WITH CONTRAST TECHNIQUE: Multidetector CT imaging of the chest was performed using the standard protocol during bolus administration of intravenous contrast. Multiplanar CT image reconstructions and MIPs were obtained to evaluate the vascular anatomy. CONTRAST:  61m OMNIPAQUE IOHEXOL 350 MG/ML SOLN COMPARISON:  Chest x-ray from same day. FINDINGS: Cardiovascular: Satisfactory opacification of the pulmonary arteries to the segmental level. No evidence of pulmonary embolism. Normal heart size. No pericardial effusion. No thoracic aortic aneurysm or dissection. Mediastinum/Nodes: No enlarged mediastinal, hilar, or axillary lymph nodes. Tiny subcentimeter hypodense nodules and calcifications in the thyroid gland. Not clinically significant; no follow-up imaging recommended. Trachea and esophagus demonstrate no significant findings. Lungs/Pleura: Upper lobe predominant smooth interlobular septal thickening. Upper lobe predominant patchy and confluent peribronchovascular ground-glass densities. Dependent atelectasis in both lower lobes. Small to moderate bilateral pleural effusions. No pneumothorax. Upper Abdomen: Small perihepatic ascites. Air anterior to the liver within largely decompressed transverse colon. Musculoskeletal: No chest wall abnormality. No acute or significant osseous findings. Review of the MIP images confirms the above findings. IMPRESSION: 1. No evidence of pulmonary embolism. 2. Upper lobe predominant smooth interlobular septal thickening with patchy and confluent peribronchovascular  ground-glass densities, concerning for pulmonary edema, less likely atypical infection. 3. Small to moderate bilateral pleural effusions. 4. Small perihepatic ascites. Electronically Signed   By: WTitus DubinM.D.   On: 01/11/2020 15:18   ECHOCARDIOGRAM COMPLETE  Result Date: 01/11/2020    ECHOCARDIOGRAM REPORT   Patient Name:   CBetsy PriesDate of Exam: 01/11/2020 Medical Rec #:  [id number]                Height:       62.0 in Accession #:    [id number]               Weight:       107.0 lb Date of Birth:  [birth year]               BSA:          1.465 m Patient Age:    89years                  BP:           105/70 mmHg Patient Gender: F                         HR:           96 bpm. Exam Location:  Inpatient Procedure: 2D Echo Indications:    Dyspnea 786.09 / R06.00  History:        Patient has no prior history of Echocardiogram examinations.                 Risk Factors:Non-Smoker. Pneumonia.  Sonographer:    JLeavy CellaReferring Phys: [id number] [place]ito 1. Left ventricular ejection  fraction, by estimation, is 60 to 65%. The left ventricle has normal function. The left ventricle has no regional wall motion abnormalities. Left ventricular diastolic parameters are consistent with Grade I diastolic dysfunction (impaired relaxation).  2. Right ventricular systolic function is normal. The right ventricular size is normal.  3. The mitral valve is grossly normal. Trivial mitral valve regurgitation.  4. The aortic valve is tricuspid. Aortic valve regurgitation is not visualized.  5. The inferior vena cava is dilated in size with >50% respiratory variability, suggesting right atrial pressure of 8 mmHg. FINDINGS  Left Ventricle: Left ventricular ejection fraction, by estimation, is 60 to 65%. The left ventricle has normal function. The left ventricle has no regional wall motion abnormalities. The left ventricular internal cavity size was normal in size. There is  no left ventricular  hypertrophy. Left ventricular diastolic parameters are consistent with Grade I diastolic dysfunction (impaired relaxation). Indeterminate filling pressures. Right Ventricle: The right ventricular size is normal. No increase in right ventricular wall thickness. Right ventricular systolic function is normal. Left Atrium: Left atrial size was normal in size. Right Atrium: Right atrial size was normal in size. Pericardium: There is no evidence of pericardial effusion. Mitral Valve: The mitral valve is grossly normal. Trivial mitral valve regurgitation. Tricuspid Valve: The tricuspid valve is grossly normal. Tricuspid valve regurgitation is trivial. Aortic Valve: The aortic valve is tricuspid. Aortic valve regurgitation is not visualized. Pulmonic Valve: The pulmonic valve was normal in structure. Pulmonic valve regurgitation is not visualized. Aorta: The aortic root and ascending aorta are structurally normal, with no evidence of dilitation. Venous: The inferior vena cava is dilated in size with greater than 50% respiratory variability, suggesting right atrial pressure of 8 mmHg. IAS/Shunts: No atrial level shunt detected by color flow Doppler. Additional Comments: There is a small pleural effusion in the right lateral region. Mild ascites is present.  LEFT VENTRICLE PLAX 2D LVIDd:         4.80 cm  Diastology LVIDs:         3.40 cm  LV e' lateral:   8.05 cm/s LV PW:         0.80 cm  LV E/e' lateral: 10.2 LV IVS:        0.90 cm  LV e' medial:    8.16 cm/s LVOT diam:     1.90 cm  LV E/e' medial:  10.0 LVOT Area:     2.84 cm  RIGHT VENTRICLE RV S prime:     21.30 cm/s TAPSE (M-mode): 2.2 cm LEFT ATRIUM           Index LA diam:      2.40 cm 1.64 cm/m LA Vol (A2C): 49.8 ml 33.98 ml/m LA Vol (A4C): 39.2 ml 26.75 ml/m   AORTA Ao Root diam: 3.00 cm MITRAL VALVE MV Area (PHT): 5.27 cm    SHUNTS MV Decel Time: 144 msec    Systemic Diam: 1.90 cm MV E velocity: 82.00 cm/s MV A velocity: 42.70 cm/s MV E/A ratio:  1.92 Lyman Bishop MD Electronically signed by Lyman Bishop MD Signature Date/Time: 01/11/2020/4:49:56 PM    Final    US Abdomen Limited RUQ  Result Date: 01/11/2020 CLINICAL DATA:  Nausea and right upper quadrant pain since Thursday. EXAM: ULTRASOUND ABDOMEN LIMITED RIGHT UPPER QUADRANT COMPARISON:  None. FINDINGS: Gallbladder: Sludge. No gallstones. Asymmetric wall thickening and edema measuring up to 9 mm. No sonographic Murphy sign noted by sonographer. Common bile duct: Diameter: 5 mm, normal. Liver: 7 x  9 x 8 mm echogenic lesion in the right. Within normal limits in parenchymal echogenicity. Portal vein is patent on color Doppler imaging with normal direction of blood flow towards the liver. Other: Small perihepatic ascites.  Small right pleural effusion. IMPRESSION: 1. Gallbladder sludge with asymmetric wall thickening and edema. Given small perihepatic ascites, small right pleural effusion, and elevated LFTs, the gallbladder appearance is favored related to underlying liver disease, although acalculus cholecystitis is not entirely excluded. 2. Small 0.9 cm hyperechoic lesion in the right lobe of the liver, probably a hemangioma, benign. If definitive characterization is required, consider follow-up MRI abdomen without and with MultiHance in 6 months. Electronically Signed   By: Titus Dubin M.D.   On: 01/11/2020 12:42    ASSESSMENT / PLAN:  Acute hypoxic respiratory failure Presentation of atypical pneumonia with hyponatremia and elevated LFTs raises the question of Legionella pneumonia. Covid testing is negative as is influenza panel and RSV Hyponatremia -urine appears to be appropriately dilute with low  osmolality and low urine sodium Elevated LFTs with high alk phos suggest cholestatic picture Leukopenia and thrombocytopenia likely due to bone marrow suppression  Plan: Continue antimicrobial therapy Await Legionella results if positive then Levaquin to be the drug of choice Consider checking  Tylenol level Titrate O2 level to keep sats greater than 88%    Richardson Landry Minor ACNP Acute Care Nurse Practitioner [place] Please consult [place] 01/12/2020, 8:50 AM   Pulmonary critical care attending:  This is a 74 year old female recent trip to [facility].  Did have three ticks on her at the time.  Presents with several days of lethargy shortness of breath.  CT scan with bilateral peribronchovascular infiltrates concerning for noncardiogenic pulmonary edema.  Also hyponatremia elevated LFTs concerning for an atypical infection.  Legionella antigen pending.  Rickettsial disease pending.  BP 103/71 (BP Location: Left Arm)   Pulse 91   Temp 98.6 F (37 C) (Oral)   Resp 20   Ht '5\' 2"'  (1.575 m)   Wt 48.5 kg   SpO2 91%   BMI 19.57 kg/m   General: Elderly female tachypneic, short of breath resting in bed. HEENT: Tracking appropriately no scleral icterus Heart: Tachycardic regular Lungs: Crackles bilaterally lower bases, no wheeze Abdomen: Soft Skin: No petechia  Labs: Reviewed Sodium 129, serum creatinine 0.66, AST 750, ALT 319, procalcitonin 2.4.  Assessment: Sepsis Tick exposure Elevated troponin Elevated AST, ALT Hyponatremia Leukopenia Bilateral pulmonary infiltrates, pulmonary edema versus noncardiogenic pulmonary edema -With current exposure history and abnormalities of her current labs leads to questionable diagnosis of underlying rickettsial disease  Pain: Agree with doxycycline Discussed with cardiology. Recommend infectious disease involvement. Appreciate multidisciplinary approach. If respiratory status continues to worsen may need to consider upgrade to the intensive care unit. At this time she appears stable.  Would agree that she may benefit from progressive care unit.  Garner Nash, DO [facility] 01/12/2020 12:43 PM

## 2020-01-12 NOTE — ED Notes (Signed)
Dr. Radford Pax, Cardiologist at bedside

## 2020-01-12 NOTE — ED Notes (Addendum)
Pt assisted to bedside commode then back to stretcher, pt noted to be in SVT on cardiac monitor at rate of 153. O2 sats 87% on 15L via NRB. Admitting provider paged.

## 2020-01-12 NOTE — ED Notes (Signed)
O2 sats 89-90%, pt remains on 10L high flow humidified O2 via n.c.

## 2020-01-12 NOTE — ED Notes (Signed)
Kristina Myers with [facility] paged and made aware of HR 154 and O2 Sat of 87% on NRB. Provider advised this RN she will place PRN metoprolol and to give patient until 7am, if HR has not gone down, PRN metoprolol can be administered.

## 2020-01-12 NOTE — Progress Notes (Signed)
Pt resting in bed. Breathing unlabored.  RN decreased oxygen to 12L high flow.  Pt satting at 98% on 12L high flow.

## 2020-01-12 NOTE — ED Notes (Signed)
Tele  Breakfast Ordered 

## 2020-01-12 NOTE — Progress Notes (Addendum)
Progress Note  Patient Name: Kristina Myers Date of Encounter: 01/12/2020  Primary Cardiologist: No primary care provider on file.   Subjective   Looks worse this am.  Remains SOB and on 10L O2 to maintain O2 sats > 88%.  Tele with SVT  Inpatient Medications    Scheduled Meds: . aspirin EC  81 mg Oral Daily  . benzonatate  200 mg Oral TID  . budesonide (PULMICORT) nebulizer solution  0.25 mg Nebulization BID  . calcium carbonate  1 tablet Oral Q breakfast  . ipratropium-albuterol  3 mL Nebulization Q6H  . multivitamin with minerals  1 tablet Oral Daily  . potassium chloride  20 mEq Oral Once   Continuous Infusions: . cefTRIAXone (ROCEPHIN)  IV    . doxycycline (VIBRAMYCIN) IV 100 mg (01/12/20 1100)   PRN Meds: acetaminophen **OR** acetaminophen, ipratropium-albuterol, metoprolol tartrate, ondansetron **OR** ondansetron (ZOFRAN) IV, oxyCODONE   Vital Signs    Vitals:   01/12/20 0840 01/12/20 0851 01/12/20 0940 01/12/20 1043  BP: (!) 104/91  100/71 103/71  Pulse:  (!) 132 (!) 131 97  Resp:  (!) 34 (!) 26 (!) 31  Temp:      TempSrc:    Oral  SpO2:  (!) 89% 92% 91%  Weight:      Height:        Intake/Output Summary (Last 24 hours) at 01/12/2020 1231 Last data filed at 01/12/2020 0935 Gross per 24 hour  Intake 220 ml  Output 275 ml  Net -55 ml   Filed Weights   01/11/20 0822  Weight: 48.5 kg    Telemetry    SVT at 130bpm - Personally Reviewed  ECG    SVT at 130bpm - Personally Reviewed  Physical Exam   GEN: well developed well nourished, ill appearing in mild respiratory distress Neck: No JVD Cardiac: regular and tachy, no murmurs, rubs, or gallops.  Respiratory: dffuse crackles throughout GI: Soft, nontender, non-distended  MS: No edema; No deformity. Neuro:  Nonfocal  Psych: Normal affect   Labs    Chemistry Recent Labs  Lab 01/11/20 0832 01/12/20 0649  NA 120* 129*  K 3.9 3.4*  CL 89* 95*  CO2 24 22  GLUCOSE 119* 135*  BUN 8  9  CREATININE 0.62 0.66  CALCIUM 7.8* 7.6*  PROT 5.9* 5.6*  ALBUMIN 3.1* 2.8*  AST 908* 751*  ALT 349* 319*  ALKPHOS 489* 644*  BILITOT 0.8 0.9  GFRNONAA >60 >60  GFRAA >60 >60  ANIONGAP 7 12     Hematology Recent Labs  Lab 01/11/20 0832 01/11/20 1044 01/12/20 0649  WBC 2.1* 1.9* 2.9*  RBC 4.03 4.00 4.35  HGB 12.7 12.8 13.7  HCT 37.0 36.8 40.3  MCV 91.8 92.0 92.6  MCH 31.5 32.0 31.5  MCHC 34.3 34.8 34.0  RDW 11.9 11.9 12.2  PLT 65* 63* 75*    Cardiac EnzymesNo results for input(s): TROPONINI in the last 168 hours. No results for input(s): TROPIPOC in the last 168 hours.   BNP Recent Labs  Lab 01/11/20 1312  BNP 66.5     DDimer  Recent Labs  Lab 01/11/20 1044  DDIMER 7.03*     Radiology    DG Chest 1 View  Result Date: 01/12/2020 CLINICAL DATA:  Pneumonia, increasing work of breathing EXAM: CHEST  1 VIEW COMPARISON:  CT and radiograph 01/11/2020 FINDINGS: Increasing airspace opacity in the mid to upper lungs and right lung base. Bilateral pleural effusions are again seen. Background of  coarse reticular interstitial opacity throughout both lungs. Cardiomediastinal contours are stable. The aorta is calcified. The remaining cardiomediastinal contours are unremarkable. No acute osseous or soft tissue abnormality. Degenerative changes are present in the imaged spine and shoulders. Telemetry leads overlie the chest. IMPRESSION: Increasing airspace opacity in the mid to upper lungs and right lung base which could reflect edema and/or infection. Similar distribution to the recent CT. Bilateral effusions. Aortic Atherosclerosis (ICD10-I70.0). Electronically Signed   By: Lovena Le M.D.   On: 01/12/2020 06:00   DG Chest 2 View  Result Date: 01/11/2020 CLINICAL DATA:  Cough, nausea, headache, recent shingles vaccine. EXAM: CHEST - 2 VIEW COMPARISON:  09/14/2013 chest radiograph. FINDINGS: Stable cardiomediastinal silhouette with normal heart size. No pneumothorax. No  pleural effusion. New diffuse streaky parahilar opacities throughout both lungs. IMPRESSION: New diffuse streaky parahilar opacities throughout both lungs, which could represent atypical pneumonia or pulmonary edema. Recommend follow-up chest radiographs. Electronically Signed   By: Ilona Sorrel M.D.   On: 01/11/2020 08:45   CT Angio Chest PE W and/or Wo Contrast  Result Date: 01/11/2020 CLINICAL DATA:  Cough, chills, and vomiting. EXAM: CT ANGIOGRAPHY CHEST WITH CONTRAST TECHNIQUE: Multidetector CT imaging of the chest was performed using the standard protocol during bolus administration of intravenous contrast. Multiplanar CT image reconstructions and MIPs were obtained to evaluate the vascular anatomy. CONTRAST:  35mL OMNIPAQUE IOHEXOL 350 MG/ML SOLN COMPARISON:  Chest x-ray from same day. FINDINGS: Cardiovascular: Satisfactory opacification of the pulmonary arteries to the segmental level. No evidence of pulmonary embolism. Normal heart size. No pericardial effusion. No thoracic aortic aneurysm or dissection. Mediastinum/Nodes: No enlarged mediastinal, hilar, or axillary lymph nodes. Tiny subcentimeter hypodense nodules and calcifications in the thyroid gland. Not clinically significant; no follow-up imaging recommended. Trachea and esophagus demonstrate no significant findings. Lungs/Pleura: Upper lobe predominant smooth interlobular septal thickening. Upper lobe predominant patchy and confluent peribronchovascular ground-glass densities. Dependent atelectasis in both lower lobes. Small to moderate bilateral pleural effusions. No pneumothorax. Upper Abdomen: Small perihepatic ascites. Air anterior to the liver within largely decompressed transverse colon. Musculoskeletal: No chest wall abnormality. No acute or significant osseous findings. Review of the MIP images confirms the above findings. IMPRESSION: 1. No evidence of pulmonary embolism. 2. Upper lobe predominant smooth interlobular septal thickening  with patchy and confluent peribronchovascular ground-glass densities, concerning for pulmonary edema, less likely atypical infection. 3. Small to moderate bilateral pleural effusions. 4. Small perihepatic ascites. Electronically Signed   By: Titus Dubin M.D.   On: 01/11/2020 15:18   ECHOCARDIOGRAM COMPLETE  Result Date: 01/11/2020    ECHOCARDIOGRAM REPORT   Patient Name:   LAURELLA TULL Date of Exam: 01/11/2020 Medical Rec #:  [id number]                 Height:       62.0 in Accession #:    [id number]                Weight:       107.0 lb Date of Birth:  09-03-1945                BSA:          1.465 m Patient Age:    22 years                  BP:           105/70 mmHg Patient Gender: F  HR:           96 bpm. Exam Location:  Inpatient Procedure: 2D Echo Indications:    Dyspnea 786.09 / R06.00  History:        Patient has no prior history of Echocardiogram examinations.                 Risk Factors:Non-Smoker. Pneumonia.  Sonographer:    Leavy Cella Referring Phys: [id number] [place]  1. Left ventricular ejection fraction, by estimation, is 60 to 65%. The left ventricle has normal function. The left ventricle has no regional wall motion abnormalities. Left ventricular diastolic parameters are consistent with Grade I diastolic dysfunction (impaired relaxation).  2. Right ventricular systolic function is normal. The right ventricular size is normal.  3. The mitral valve is grossly normal. Trivial mitral valve regurgitation.  4. The aortic valve is tricuspid. Aortic valve regurgitation is not visualized.  5. The inferior vena cava is dilated in size with >50% respiratory variability, suggesting right atrial pressure of 8 mmHg. FINDINGS  Left Ventricle: Left ventricular ejection fraction, by estimation, is 60 to 65%. The left ventricle has normal function. The left ventricle has no regional wall motion abnormalities. The left ventricular internal cavity size was  normal in size. There is  no left ventricular hypertrophy. Left ventricular diastolic parameters are consistent with Grade I diastolic dysfunction (impaired relaxation). Indeterminate filling pressures. Right Ventricle: The right ventricular size is normal. No increase in right ventricular wall thickness. Right ventricular systolic function is normal. Left Atrium: Left atrial size was normal in size. Right Atrium: Right atrial size was normal in size. Pericardium: There is no evidence of pericardial effusion. Mitral Valve: The mitral valve is grossly normal. Trivial mitral valve regurgitation. Tricuspid Valve: The tricuspid valve is grossly normal. Tricuspid valve regurgitation is trivial. Aortic Valve: The aortic valve is tricuspid. Aortic valve regurgitation is not visualized. Pulmonic Valve: The pulmonic valve was normal in structure. Pulmonic valve regurgitation is not visualized. Aorta: The aortic root and ascending aorta are structurally normal, with no evidence of dilitation. Venous: The inferior vena cava is dilated in size with greater than 50% respiratory variability, suggesting right atrial pressure of 8 mmHg. IAS/Shunts: No atrial level shunt detected by color flow Doppler. Additional Comments: There is a small pleural effusion in the right lateral region. Mild ascites is present.  LEFT VENTRICLE PLAX 2D LVIDd:         4.80 cm  Diastology LVIDs:         3.40 cm  LV e' lateral:   8.05 cm/s LV PW:         0.80 cm  LV E/e' lateral: 10.2 LV IVS:        0.90 cm  LV e' medial:    8.16 cm/s LVOT diam:     1.90 cm  LV E/e' medial:  10.0 LVOT Area:     2.84 cm  RIGHT VENTRICLE RV S prime:     21.30 cm/s TAPSE (M-mode): 2.2 cm LEFT ATRIUM           Index LA diam:      2.40 cm 1.64 cm/m LA Vol (A2C): 49.8 ml 33.98 ml/m LA Vol (A4C): 39.2 ml 26.75 ml/m   AORTA Ao Root diam: 3.00 cm MITRAL VALVE MV Area (PHT): 5.27 cm    SHUNTS MV Decel Time: 144 msec    Systemic Diam: 1.90 cm MV E velocity: 82.00 cm/s MV A  velocity: 42.70 cm/s MV E/A ratio:  1.92 Lyman Bishop MD Electronically signed by Lyman Bishop MD Signature Date/Time: 01/11/2020/4:49:56 PM    Final    US Abdomen Limited RUQ  Result Date: 01/11/2020 CLINICAL DATA:  Nausea and right upper quadrant pain since Thursday. EXAM: ULTRASOUND ABDOMEN LIMITED RIGHT UPPER QUADRANT COMPARISON:  None. FINDINGS: Gallbladder: Sludge. No gallstones. Asymmetric wall thickening and edema measuring up to 9 mm. No sonographic Murphy sign noted by sonographer. Common bile duct: Diameter: 5 mm, normal. Liver: 7 x 9 x 8 mm echogenic lesion in the right. Within normal limits in parenchymal echogenicity. Portal vein is patent on color Doppler imaging with normal direction of blood flow towards the liver. Other: Small perihepatic ascites.  Small right pleural effusion. IMPRESSION: 1. Gallbladder sludge with asymmetric wall thickening and edema. Given small perihepatic ascites, small right pleural effusion, and elevated LFTs, the gallbladder appearance is favored related to underlying liver disease, although acalculus cholecystitis is not entirely excluded. 2. Small 0.9 cm hyperechoic lesion in the right lobe of the liver, probably a hemangioma, benign. If definitive characterization is required, consider follow-up MRI abdomen without and with MultiHance in 6 months. Electronically Signed   By: Titus Dubin M.D.   On: 01/11/2020 12:42    Cardiac Studies   2D echo 01/09/2020 IMPRESSIONS    1. Left ventricular ejection fraction, by estimation, is 60 to 65%. The  left ventricle has normal function. The left ventricle has no regional  wall motion abnormalities. Left ventricular diastolic parameters are  consistent with Grade I diastolic  dysfunction (impaired relaxation).  2. Right ventricular systolic function is normal. The right ventricular  size is normal.  3. The mitral valve is grossly normal. Trivial mitral valve  regurgitation.  4. The aortic valve is  tricuspid. Aortic valve regurgitation is not  visualized.  5. The inferior vena cava is dilated in size with >50% respiratory  variability, suggesting right atrial pressure of 8 mmHg.   Patient Profile     74 y.o. female with no significant PMH who is being seen today for the evaluation of SOB at the request of Wynetta Fines, MD.  [place]    1. Acute respiratory failure with hypoxia -started after developing what sounds like a respiratory illness with chills, cough, N/V -now with hypoxia>>lung exam appears worse this am and now on 10L O2 to maintain O2 sats > 88% -CCM following and concerned over possible Legionella due to coexisting, hepatic transaminitis, hyponatremia -continue antibx -BNP is normal  -chest CT neg for PE but does show bilateral pleural effusions, pulmonary edema or infection>>in setting of normal BNp in a thin patient, suspect this is not CHF -I am concerned that patient may have tick borne illness given labs and other sx and tick exposure in the past week  -may have noncardiogenic pulmonary edema  2.  Elevated troponin -hsTrop mildly elevated with flat trend ([id number]) -EKG is nonischemic and she has no CRFs except advanced age.  She has never smoked and has no fm hx of CAD -2D echo with normal LVF -this is consistent with demand ischemia -no further workup for ischemia indicated at this time  3.  Hyponatremia -Na 120 on admit and improved today to 129 -suspect related to underlying pulmonary issue and possible tick borne illness -per CCM  4.  Hepatic transaminitis  -likely related to acute viral/bacterial syndrome>>again likely RMSF with recent tick exposure -LFTs slightly improved -continue to follow  5.  Leukopenic/Thrombocytopenic -? Related to bone marrow suppression from acute infection -  WBC up to 2.9 today and plt ct slightly improved at 75K -need to consider tick borne diseases as well given her elevated LFTs, hyponatremia, bone marrow  suppression, N/V/diarrhea and body aches -would broaden antibx coverage with Doxycycline and get ID consult  6.  SVT -possible atrial flutter with RVR vs. Sinus tachycardia with long 1st degree AV block -suspect driven by underlying respiratory illness -check 12 lead EKG -may need to consider IV Cardizem gtt but BP currently soft  I have spent a total of 40 minutes with patient reviewing 2D echo, discussing case with TRH and Pulmonary , telemetry, EKGs, labs and examining patient as well as establishing an assessment and plan that was discussed with the patient.  > 50% of time was spent in direct patient care.    For questions or updates, please contact [place] Please consult [email or web address] for contact info under Cardiology/STEMI.      Signed, Fransico Him, MD  01/12/2020, 12:31 PM

## 2020-01-12 NOTE — ED Notes (Signed)
Critical care at bedside  

## 2020-01-13 ENCOUNTER — Inpatient Hospital Stay (HOSPITAL_COMMUNITY): Payer: Medicare Other

## 2020-01-13 DIAGNOSIS — R0602 Shortness of breath: Secondary | ICD-10-CM

## 2020-01-13 DIAGNOSIS — D696 Thrombocytopenia, unspecified: Secondary | ICD-10-CM

## 2020-01-13 DIAGNOSIS — R000 Tachycardia, unspecified: Secondary | ICD-10-CM

## 2020-01-13 DIAGNOSIS — R778 Other specified abnormalities of plasma proteins: Secondary | ICD-10-CM

## 2020-01-13 DIAGNOSIS — R0902 Hypoxemia: Secondary | ICD-10-CM

## 2020-01-13 LAB — CBC WITH DIFFERENTIAL/PLATELET
Abs Immature Granulocytes: 0.04 10*3/uL (ref 0.00–0.07)
Basophils Absolute: 0 10*3/uL (ref 0.0–0.1)
Basophils Relative: 0 %
Eosinophils Absolute: 0 10*3/uL (ref 0.0–0.5)
Eosinophils Relative: 0 %
HCT: 37.2 % (ref 36.0–46.0)
Hemoglobin: 12.7 g/dL (ref 12.0–15.0)
Immature Granulocytes: 1 %
Lymphocytes Relative: 20 %
Lymphs Abs: 0.9 10*3/uL (ref 0.7–4.0)
MCH: 31.6 pg (ref 26.0–34.0)
MCHC: 34.1 g/dL (ref 30.0–36.0)
MCV: 92.5 fL (ref 80.0–100.0)
Monocytes Absolute: 0.3 10*3/uL (ref 0.1–1.0)
Monocytes Relative: 6 %
Neutro Abs: 3.4 10*3/uL (ref 1.7–7.7)
Neutrophils Relative %: 73 %
Platelets: 94 10*3/uL — ABNORMAL LOW (ref 150–400)
RBC: 4.02 MIL/uL (ref 3.87–5.11)
RDW: 12.4 % (ref 11.5–15.5)
WBC: 4.7 10*3/uL (ref 4.0–10.5)
nRBC: 0 % (ref 0.0–0.2)

## 2020-01-13 LAB — MAGNESIUM: Magnesium: 2 mg/dL (ref 1.7–2.4)

## 2020-01-13 LAB — COMPREHENSIVE METABOLIC PANEL
ALT: 225 U/L — ABNORMAL HIGH (ref 0–44)
AST: 472 U/L — ABNORMAL HIGH (ref 15–41)
Albumin: 2.5 g/dL — ABNORMAL LOW (ref 3.5–5.0)
Alkaline Phosphatase: 519 U/L — ABNORMAL HIGH (ref 38–126)
Anion gap: 8 (ref 5–15)
BUN: 10 mg/dL (ref 8–23)
CO2: 24 mmol/L (ref 22–32)
Calcium: 7.3 mg/dL — ABNORMAL LOW (ref 8.9–10.3)
Chloride: 97 mmol/L — ABNORMAL LOW (ref 98–111)
Creatinine, Ser: 0.67 mg/dL (ref 0.44–1.00)
GFR calc Af Amer: >60 mL/min (ref >60)
GFR calc non Af Amer: >60 mL/min (ref >60)
Glucose, Bld: 125 mg/dL — ABNORMAL HIGH (ref 70–99)
Potassium: 3.8 mmol/L (ref 3.5–5.1)
Sodium: 129 mmol/L — ABNORMAL LOW (ref 135–145)
Total Bilirubin: 0.8 mg/dL (ref 0.3–1.2)
Total Protein: 5.1 g/dL — ABNORMAL LOW (ref 6.5–8.1)

## 2020-01-13 LAB — PROCALCITONIN: Procalcitonin: 1.55 ng/mL

## 2020-01-13 LAB — PHOSPHORUS: Phosphorus: 1.2 mg/dL — ABNORMAL LOW (ref 2.5–4.6)

## 2020-01-13 LAB — B. BURGDORFI ANTIBODIES: B burgdorferi Ab IgG+IgM: <0.91 {ISR} (ref 0.00–0.90)

## 2020-01-13 MED ORDER — SODIUM PHOSPHATES 45 MMOLE/15ML IV SOLN
20.0000 mmol | Freq: Once | INTRAVENOUS | Status: AC
  Administered 2020-01-13: 20 mmol via INTRAVENOUS
  Filled 2020-01-13: qty 6.67

## 2020-01-13 MED ORDER — POLYVINYL ALCOHOL 1.4 % OP SOLN
1.0000 [drp] | OPHTHALMIC | Status: DC | PRN
  Administered 2020-01-13: 1 [drp] via OPHTHALMIC
  Filled 2020-01-13: qty 15

## 2020-01-13 MED ORDER — BUDESONIDE 0.5 MG/2ML IN SUSP
0.5000 mg | Freq: Two times a day (BID) | RESPIRATORY_TRACT | Status: DC
  Administered 2020-01-13 – 2020-01-18 (×10): 0.5 mg via RESPIRATORY_TRACT
  Filled 2020-01-13 (×10): qty 2

## 2020-01-13 NOTE — Progress Notes (Signed)
PROGRESS NOTE    Rogelio Winbush Tencza  FWY:[id number] DOB: 08-14-1945 DOA: 01/11/2020 PCP: Kelton Pillar, MD      Brief Narrative:  Mrs. Travieso is a 74 y.o. F with no significant PMHx who presented with dry cough, chills, headache.  Her symptoms progressively worsened until she was short of breath so she came to the ER.  In the ER, chest x-ray showed bilateral infiltrates, WBC 2.1, platelets 65, AST 908 and sodium 120.        Assessment & Plan:  Leukopenia, hyponatremia, transaminitis, thrombocytopenia Patient recently spent a few weeks at her vacation home at [facility] in [place].  During that time she was gardening, found several ticks on her.  This pattern of leukopenia, hyponatremia, transaminitis, and thrombocytopenia seems typical for tickborne disease. -Continue doxycycline -Follow RMSF serologies, ehrlichia serologies  -Add on smear  Acute respiratory failure with hypoxia The differential here seems to favor ARDS in setting of tickborne illness, less likely Pulmonary edema or sepsis from pneumonia.    Weaned down from 15L to 2-4L today after starting doxycycline.  No response to furosemide. Echo with mild diastolic dysfunction, normal EF. -Continue Pulmicort -Continue ceftriaxone for now, day 3 -Consult Pulmonlogy, appreciate cares  Elevated troponin Demand ishcemia, no further ischemic work up necessary, ACS ruled out.  SVT Brief, nonsustained, responded to metoprolol.    Liver cyst -Repeat US in 6 months          Disposition: Status is: Inpatient  Remains inpatient appropriate because:still requiring large amounts supplemetnal o2,    Dispo: The patient is from: Home              Anticipated d/c is to: Home              Anticipated d/c date is: 3 days              Patient currently is not medically stable to d/c.              MDM: The below labs and imaging reports were reviewed and summarized above.   Medication management as above. This is a severe illness with threat to life or bodily function   DVT prophylaxis: SCDs Code Status: FULL Family Communication: Husband and daughter    Consultants:   Pulm  Cards  ID  Procedures:   Echo  Antimicrobials:   Doxycycline 6/8 >>  Ceftriaxone 6/7 >>    Culture data:              Subjective: Patient is very tired.  No fever overnight.  Patient is very tired.  No fever overnight.  Had some mild delirium/confusion, disorientation yesterday.  No rash.  Objective: Vitals:   01/13/20 1028 01/13/20 1128 01/13/20 1225 01/13/20 1227  BP:   (!) 84/51 (!) 95/58  Pulse: 86 82 84 82  Resp: 20 (!) 21 (!) 25 (!) 23  Temp:   97.6 F (36.4 C)   TempSrc:   Oral   SpO2: 91% 96% 99% 100%  Weight:      Height:        Intake/Output Summary (Last 24 hours) at 01/13/2020 1525 Last data filed at 01/12/2020 2154 Gross per 24 hour  Intake 450 ml  Output 500 ml  Net -50 ml   Filed Weights   01/11/20 0822  Weight: 48.5 kg    Examination: General appearance:  adult female, alert and in mild respiratory distress.   HEENT: Anicteric, conjunctiva pink, lids and lashes normal.  No nasal deformity, discharge, epistaxis.  Lips moist, dentition normal, oropharynx moist, no oral lesions, hearing somewhat diminished.   Skin: Warm and dry.  No jaundice.  No suspicious rashes or lesions on face, neck, hands, wrists, forearms, abdomen, ankles, legs. Cardiac: RRR, nl S1-S2, no murmurs appreciated.  Capillary refill is brisk.  JVP not visible.  No LE edema.  Radial pulses 2+ and symmetric. Respiratory: Tachypneic.  Rales bilaterally. Abdomen: Abdomen soft.  No TTP or guarding. No ascites, distension, hepatosplenomegaly.  Old cesarean scar in the abdomen. MSK: No deformities or effusions. Neuro: Awake and alert.  EOMI, moves all extremities with severe generalized weakness. Speech fluent.    Psych: Sensorium intact and responding to questions,  attention normal. Affect blunted.  Judgment and insight appear normal.    Data Reviewed: I have personally reviewed following labs and imaging studies:  CBC: Recent Labs  Lab 01/11/20 0832 01/11/20 1044 01/12/20 0649 01/13/20 0300  WBC 2.1* 1.9* 2.9* 4.7  NEUTROABS  --  1.5*  --  3.4  HGB 12.7 12.8 13.7 12.7  HCT 37.0 36.8 40.3 37.2  MCV 91.8 92.0 92.6 92.5  PLT 65* 63* 75* 94*   Basic Metabolic Panel: Recent Labs  Lab 01/11/20 0832 01/12/20 0649 01/13/20 0300  NA 120* 129* 129*  K 3.9 3.4* 3.8  CL 89* 95* 97*  CO2 24 22 24   GLUCOSE 119* 135* 125*  BUN 8 9 10   CREATININE 0.62 0.66 0.67  CALCIUM 7.8* 7.6* 7.3*  MG  --   --  2.0  PHOS  --   --  1.2*   GFR: Estimated Creatinine Clearance: 48 mL/min (by C-G formula based on SCr of 0.67 mg/dL). Liver Function Tests: Recent Labs  Lab 01/11/20 0832 01/12/20 0649 01/13/20 0300  AST 908* 751* 472*  ALT 349* 319* 225*  ALKPHOS 489* 644* 519*  BILITOT 0.8 0.9 0.8  PROT 5.9* 5.6* 5.1*  ALBUMIN 3.1* 2.8* 2.5*   Recent Labs  Lab 01/11/20 0832  LIPASE 41   No results for input(s): AMMONIA in the last 168 hours. Coagulation Profile: Recent Labs  Lab 01/11/20 1044  INR 1.0   Cardiac Enzymes: Recent Labs  Lab 01/11/20 1253  CKTOTAL 167   BNP (last 3 results) No results for input(s): PROBNP in the last 8760 hours. HbA1C: No results for input(s): HGBA1C in the last 72 hours. CBG: No results for input(s): GLUCAP in the last 168 hours. Lipid Profile: Recent Labs    01/12/20 1011  CHOL 120  HDL 36*  LDLCALC 55  TRIG 143  CHOLHDL 3.3   Thyroid Function Tests: Recent Labs    01/12/20 1011  TSH 0.370   Anemia Panel: No results for input(s): VITAMINB12, FOLATE, FERRITIN, TIBC, IRON, RETICCTPCT in the last 72 hours. Urine analysis:    Component Value Date/Time   COLORURINE STRAW (A) 01/11/2020 1055   APPEARANCEUR CLEAR 01/11/2020 1055   LABSPEC 1.002 (L) 01/11/2020 1055   PHURINE 7.0 01/11/2020  1055   GLUCOSEU NEGATIVE 01/11/2020 1055   HGBUR NEGATIVE 01/11/2020 1055   BILIRUBINUR NEGATIVE 01/11/2020 1055   KETONESUR NEGATIVE 01/11/2020 1055   PROTEINUR NEGATIVE 01/11/2020 1055   NITRITE NEGATIVE 01/11/2020 1055   LEUKOCYTESUR NEGATIVE 01/11/2020 1055   Sepsis Labs: @LABRCNTIP (procalcitonin:4,lacticacidven:4)  ) Recent Results (from the past 240 hour(s))  SARS Coronavirus 2 by RT PCR (hospital order, performed in [place] hospital lab) Nasopharyngeal Nasopharyngeal Swab     Status: None   Collection Time: 01/11/20 10:21 AM  Specimen: Nasopharyngeal Swab  Result Value Ref Range Status   SARS Coronavirus 2 NEGATIVE NEGATIVE Final    Comment: (NOTE) SARS-CoV-2 target nucleic acids are NOT DETECTED. The SARS-CoV-2 RNA is generally detectable in upper and lower respiratory specimens during the acute phase of infection. The lowest concentration of SARS-CoV-2 viral copies this assay can detect is 250 copies / mL. A negative result does not preclude SARS-CoV-2 infection and should not be used as the sole basis for treatment or other patient management decisions.  A negative result may occur with improper specimen collection / handling, submission of specimen other than nasopharyngeal swab, presence of viral mutation(s) within the areas targeted by this assay, and inadequate number of viral copies (<250 copies / mL). A negative result must be combined with clinical observations, patient history, and epidemiological information. Fact Sheet for Patients:   StrictlyIdeas.no Fact Sheet for Healthcare Providers: BankingDealers.co.za This test is not yet approved or cleared  by the [place] FDA and has been authorized for detection and/or diagnosis of SARS-CoV-2 by FDA under an Emergency Use Authorization (EUA).  This EUA will remain in effect (meaning this test can be used) for the duration of the COVID-19 declaration under  Section 564(b)(1) of the Act, 21 U.S.C. section 360bbb-3(b)(1), unless the authorization is terminated or revoked sooner. Performed at [facility], [place] [address], [place], [place] [address]   Respiratory Panel by PCR     Status: None   Collection Time: 01/11/20 10:21 AM   Specimen: Nasopharyngeal Swab; Respiratory  Result Value Ref Range Status   Adenovirus NOT DETECTED NOT DETECTED Final   Coronavirus 229E NOT DETECTED NOT DETECTED Final    Comment: (NOTE) The Coronavirus on the Respiratory Panel, DOES NOT test for the novel  Coronavirus (2019 nCoV)    Coronavirus HKU1 NOT DETECTED NOT DETECTED Final   Coronavirus NL63 NOT DETECTED NOT DETECTED Final   Coronavirus OC43 NOT DETECTED NOT DETECTED Final   Metapneumovirus NOT DETECTED NOT DETECTED Final   Rhinovirus / Enterovirus NOT DETECTED NOT DETECTED Final   Influenza A NOT DETECTED NOT DETECTED Final   Influenza B NOT DETECTED NOT DETECTED Final   Parainfluenza Virus 1 NOT DETECTED NOT DETECTED Final   Parainfluenza Virus 2 NOT DETECTED NOT DETECTED Final   Parainfluenza Virus 3 NOT DETECTED NOT DETECTED Final   Parainfluenza Virus 4 NOT DETECTED NOT DETECTED Final   Respiratory Syncytial Virus NOT DETECTED NOT DETECTED Final   Bordetella pertussis NOT DETECTED NOT DETECTED Final   Chlamydophila pneumoniae NOT DETECTED NOT DETECTED Final   Mycoplasma pneumoniae NOT DETECTED NOT DETECTED Final    Comment: Performed at [facility], [place]. [address], [place], [place] [address]  Resp Panel by RT PCR (RSV, Flu A&B, Covid) - Nasopharyngeal Swab     Status: None   Collection Time: 01/11/20  4:34 PM   Specimen: Nasopharyngeal Swab  Result Value Ref Range Status   SARS Coronavirus 2 by RT PCR NEGATIVE NEGATIVE Final    Comment: (NOTE) SARS-CoV-2 target nucleic acids are NOT DETECTED. The SARS-CoV-2 RNA is generally detectable in upper respiratoy specimens during the acute phase of infection. The lowest concentration of  SARS-CoV-2 viral copies this assay can detect is 131 copies/mL. A negative result does not preclude SARS-Cov-2 infection and should not be used as the sole basis for treatment or other patient management decisions. A negative result may occur with  improper specimen collection/handling, submission of specimen other than nasopharyngeal swab, presence of  viral mutation(s) within the areas targeted by this assay, and inadequate number of viral copies (<131 copies/mL). A negative result must be combined with clinical observations, patient history, and epidemiological information. The expected result is Negative. Fact Sheet for Patients:  PinkCheek.be Fact Sheet for Healthcare Providers:  GravelBags.it This test is not yet ap proved or cleared by the [place] FDA and  has been authorized for detection and/or diagnosis of SARS-CoV-2 by FDA under an Emergency Use Authorization (EUA). This EUA will remain  in effect (meaning this test can be used) for the duration of the COVID-19 declaration under Section 564(b)(1) of the Act, 21 U.S.C. section 360bbb-3(b)(1), unless the authorization is terminated or revoked sooner.    Influenza A by PCR NEGATIVE NEGATIVE Final   Influenza B by PCR NEGATIVE NEGATIVE Final    Comment: (NOTE) The Xpert Xpress SARS-CoV-2/FLU/RSV assay is intended as an aid in  the diagnosis of influenza from Nasopharyngeal swab specimens and  should not be used as a sole basis for treatment. Nasal washings and  aspirates are unacceptable for Xpert Xpress SARS-CoV-2/FLU/RSV  testing. Fact Sheet for Patients: PinkCheek.be Fact Sheet for Healthcare Providers: GravelBags.it This test is not yet approved or cleared by the [place] FDA and  has been authorized for detection and/or diagnosis of SARS-CoV-2 by  FDA under an Emergency Use Authorization (EUA).  This EUA will remain  in effect (meaning this test can be used) for the duration of the  Covid-19 declaration under Section 564(b)(1) of the Act, 21  U.S.C. section 360bbb-3(b)(1), unless the authorization is  terminated or revoked.    Respiratory Syncytial Virus by PCR NEGATIVE NEGATIVE Final    Comment: (NOTE) Fact Sheet for Patients: PinkCheek.be Fact Sheet for Healthcare Providers: GravelBags.it This test is not yet approved or cleared by the [place] FDA and  has been authorized for detection and/or diagnosis of SARS-CoV-2 by  FDA under an Emergency Use Authorization (EUA). This EUA will remain  in effect (meaning this test can be used) for the duration of the  COVID-19 declaration under Section 564(b)(1) of the Act, 21 U.S.C.  section 360bbb-3(b)(1), unless the authorization is terminated or  revoked. Performed at [facility], [place] [address], [place], [place] [address]   MRSA PCR Screening     Status: None   Collection Time: 01/12/20 10:42 AM   Specimen: Nasal Mucosa; Nasopharyngeal  Result Value Ref Range Status   MRSA by PCR NEGATIVE NEGATIVE Final    Comment:        The GeneXpert MRSA Assay (FDA approved for NASAL specimens only), is one component of a comprehensive MRSA colonization surveillance program. It is not intended to diagnose MRSA infection nor to guide or monitor treatment for MRSA infections. Performed at [facility], [place] [address] [place], [place] [address]          Radiology Studies: DG Chest 1 View  Result Date: 01/12/2020 CLINICAL DATA:  Pneumonia, increasing work of breathing EXAM: CHEST  1 VIEW COMPARISON:  CT and radiograph 01/11/2020 FINDINGS: Increasing airspace opacity in the mid to upper lungs and right lung base. Bilateral pleural effusions are again seen. Background of coarse reticular interstitial opacity throughout both lungs. Cardiomediastinal contours are  stable. The aorta is calcified. The remaining cardiomediastinal contours are unremarkable. No acute osseous or soft tissue abnormality. Degenerative changes are present in the imaged spine and shoulders. Telemetry leads overlie the chest. IMPRESSION: Increasing airspace opacity in the mid to upper lungs  and right lung base which could reflect edema and/or infection. Similar distribution to the recent CT. Bilateral effusions. Aortic Atherosclerosis (ICD10-I70.0). Electronically Signed   By: Lovena Le M.D.   On: 01/12/2020 06:00   DG Chest Port 1 View  Result Date: 01/13/2020 CLINICAL DATA:  Respiratory distress EXAM: PORTABLE CHEST 1 VIEW COMPARISON:  January 12, 2020 FINDINGS: There is progression of consolidation in the right upper lobe compared to 1 day prior. There is stable airspace opacity throughout much of the left upper lobe as well as in the right base. There are pleural effusions bilaterally, similar to 1 day prior. Heart size normal. No adenopathy evident. Pulmonary vascularity within normal limits. No bone lesions. IMPRESSION: Multifocal airspace opacity with increase in consolidation in the right upper lobe. Other areas of airspace opacity appear stable compared to 1 day prior. Small pleural effusions bilaterally. Stable cardiac silhouette. Electronically Signed   By: Lowella Grip III M.D.   On: 01/13/2020 09:28   ECHOCARDIOGRAM COMPLETE  Result Date: 01/11/2020    ECHOCARDIOGRAM REPORT   Patient Name:   KALYIAH SAINTIL Date of Exam: 01/11/2020 Medical Rec #:  [id number]                 Height:       62.0 in Accession #:    [id number]                Weight:       107.0 lb Date of Birth:  [birth year]                BSA:          1.465 m Patient Age:    26 years                  BP:           105/70 mmHg Patient Gender: F                         HR:           96 bpm. Exam Location:  Inpatient Procedure: 2D Echo Indications:    Dyspnea 786.09 / R06.00  History:        Patient has no prior  history of Echocardiogram examinations.                 Risk Factors:Non-Smoker. Pneumonia.  Sonographer:    Leavy Cella Referring Phys: [id number] [place]  1. Left ventricular ejection fraction, by estimation, is 60 to 65%. The left ventricle has normal function. The left ventricle has no regional wall motion abnormalities. Left ventricular diastolic parameters are consistent with Grade I diastolic dysfunction (impaired relaxation).  2. Right ventricular systolic function is normal. The right ventricular size is normal.  3. The mitral valve is grossly normal. Trivial mitral valve regurgitation.  4. The aortic valve is tricuspid. Aortic valve regurgitation is not visualized.  5. The inferior vena cava is dilated in size with >50% respiratory variability, suggesting right atrial pressure of 8 mmHg. FINDINGS  Left Ventricle: Left ventricular ejection fraction, by estimation, is 60 to 65%. The left ventricle has normal function. The left ventricle has no regional wall motion abnormalities. The left ventricular internal cavity size was normal in size. There is  no left ventricular hypertrophy. Left ventricular diastolic parameters are consistent with Grade I diastolic dysfunction (impaired relaxation). Indeterminate filling pressures. Right Ventricle: The right ventricular size is normal. No increase  in right ventricular wall thickness. Right ventricular systolic function is normal. Left Atrium: Left atrial size was normal in size. Right Atrium: Right atrial size was normal in size. Pericardium: There is no evidence of pericardial effusion. Mitral Valve: The mitral valve is grossly normal. Trivial mitral valve regurgitation. Tricuspid Valve: The tricuspid valve is grossly normal. Tricuspid valve regurgitation is trivial. Aortic Valve: The aortic valve is tricuspid. Aortic valve regurgitation is not visualized. Pulmonic Valve: The pulmonic valve was normal in structure. Pulmonic valve regurgitation is  not visualized. Aorta: The aortic root and ascending aorta are structurally normal, with no evidence of dilitation. Venous: The inferior vena cava is dilated in size with greater than 50% respiratory variability, suggesting right atrial pressure of 8 mmHg. IAS/Shunts: No atrial level shunt detected by color flow Doppler. Additional Comments: There is a small pleural effusion in the right lateral region. Mild ascites is present.  LEFT VENTRICLE PLAX 2D LVIDd:         4.80 cm  Diastology LVIDs:         3.40 cm  LV e' lateral:   8.05 cm/s LV PW:         0.80 cm  LV E/e' lateral: 10.2 LV IVS:        0.90 cm  LV e' medial:    8.16 cm/s LVOT diam:     1.90 cm  LV E/e' medial:  10.0 LVOT Area:     2.84 cm  RIGHT VENTRICLE RV S prime:     21.30 cm/s TAPSE (M-mode): 2.2 cm LEFT ATRIUM           Index LA diam:      2.40 cm 1.64 cm/m LA Vol (A2C): 49.8 ml 33.98 ml/m LA Vol (A4C): 39.2 ml 26.75 ml/m   AORTA Ao Root diam: 3.00 cm MITRAL VALVE MV Area (PHT): 5.27 cm    SHUNTS MV Decel Time: 144 msec    Systemic Diam: 1.90 cm MV E velocity: 82.00 cm/s MV A velocity: 42.70 cm/s MV E/A ratio:  1.92 Lyman Bishop MD Electronically signed by Lyman Bishop MD Signature Date/Time: 01/11/2020/4:49:56 PM    Final         Scheduled Meds: . aspirin EC  81 mg Oral Daily  . benzonatate  200 mg Oral TID  . budesonide (PULMICORT) nebulizer solution  0.5 mg Nebulization BID  . calcium carbonate  1 tablet Oral Q breakfast  . ipratropium-albuterol  3 mL Nebulization QID  . multivitamin with minerals  1 tablet Oral Daily   Continuous Infusions: . cefTRIAXone (ROCEPHIN)  IV 1 g (01/13/20 0831)  . doxycycline (VIBRAMYCIN) IV 100 mg (01/12/20 2154)  . sodium phosphate  Dextrose 5% IVPB 20 mmol (01/13/20 0954)     LOS: 2 days    Time spent: 35 minutes    Edwin Dada, MD [facility] 01/13/2020, 3:25 PM     Please page though [place] or Epic secure chat:  For [employer], Adult nurse

## 2020-01-13 NOTE — Progress Notes (Addendum)
Name: Kristina Myers MRN: [id number] DOB: 1945/11/17    ADMISSION DATE:  01/11/2020 CONSULTATION DATE:  01/13/2020   REFERRING MD :  Dr. Roosevelt Locks, [place] MD  CHIEF COMPLAINT: Cough and dyspnea  BRIEF PATIENT DESCRIPTION: 74 year old F, never smoker, admitted with 2 days of dry cough and shortness of breath.  She recently received Shingrix vaccine approximately 1 week prior to admit.  After the vaccine, she had headaches and chills for 2 days, then developed vomiting and dry cough.  Her last COVID vaccine was 3/12. She had a recent trip to [facility] and found three ticks on her.   Initial evaluation found her to have room air saturations of 88% and CXR with patchy bilateral infiltrates. Labs notable for WBC 2.1, thrombocytopenia and transaminitis.  She was admitted for concern of atypical pneumonia, hypoxia, elevated LFTs and hyponatremia.  COVID testing negative.   PAST MEDICAL HISTORY:  Arthritis  C-Section with Tubal Ligation  SIGNIFICANT EVENTS  6/07 Admit   STUDIES:   CT angiogram chest 6/7  >> Upper lobe predominant smooth interlobular septal thickening with patchy and confluent peribronchovascular ground-glass densities, concerning for pulmonary edema, less likely atypical infection. Small to moderate bilateral pleural effusions.  Small perihepatic ascites.  Ultrasound liver 6/7 >> Gallbladder sludge with asymmetric wall thickening and edema.  No CBD dilatation  TTE 6/7 >> LVEF 60-65%, no RWMA, grade 1 diastolic dysfunction, RV normal size  MICRO RVP 6/7 >> negative  MRSA PCR 6/8 >> negative  COVID 6/7 >> negative  Influenza A/B 6/7 >> negative  U. Strep Antigen 6/7 >> negative  Legionella 6/7 >> negative   SUBJECTIVE:  Tmax 99.5 / WBC 4.7 Husband at bedside, reports pt fatigued. He is asking about new tick borne illness RN reports O2 weaned to 11L, sats 98%  VITAL SIGNS: Temp:  [98.5 F (36.9 C)-99.5 F (37.5 C)] 98.5 F (36.9 C) (06/09  0329) Pulse Rate:  [85-131] 85 (06/09 0735) Resp:  [18-31] 24 (06/09 0735) BP: (98-124)/(63-73) 104/69 (06/09 0329) SpO2:  [89 %-99 %] 99 % (06/09 0735)  PHYSICAL EXAMINATION: General: elderly female lying in bed in NAD  HEENT: MM pink/moist, no jvd, [place] O2, anicteric Neuro: Awakens to voice, speech clear, appears fatigued, MAE / normal strength  CV: s1s2 RRR, no m/r/g PULM: non-labored on [place] O2, lungs bilaterally posterior with crackles  GI: soft, bsx4 active  Extremities: warm/dry, no edema  Skin: no rashes or lesions, tan skin  Recent Labs  Lab 01/11/20 0832 01/12/20 0649 01/13/20 0300  NA 120* 129* 129*  K 3.9 3.4* 3.8  CL 89* 95* 97*  CO2 24 22 24   BUN 8 9 10   CREATININE 0.62 0.66 0.67  GLUCOSE 119* 135* 125*   Recent Labs  Lab 01/11/20 1044 01/12/20 0649 01/13/20 0300  HGB 12.8 13.7 12.7  HCT 36.8 40.3 37.2  WBC 1.9* 2.9* 4.7  PLT 63* 75* 94*   DG Chest 1 View  Result Date: 01/12/2020 CLINICAL DATA:  Pneumonia, increasing work of breathing EXAM: CHEST  1 VIEW COMPARISON:  CT and radiograph 01/11/2020 FINDINGS: Increasing airspace opacity in the mid to upper lungs and right lung base. Bilateral pleural effusions are again seen. Background of coarse reticular interstitial opacity throughout both lungs. Cardiomediastinal contours are stable. The aorta is calcified. The remaining cardiomediastinal contours are unremarkable. No acute osseous or soft tissue abnormality. Degenerative changes are present in the imaged spine and shoulders. Telemetry leads overlie the chest. IMPRESSION: Increasing airspace opacity in  the mid to upper lungs and right lung base which could reflect edema and/or infection. Similar distribution to the recent CT. Bilateral effusions. Aortic Atherosclerosis (ICD10-I70.0). Electronically Signed   By: Lovena Le M.D.   On: 01/12/2020 06:00   CT Angio Chest PE W and/or Wo Contrast  Result Date: 01/11/2020 CLINICAL DATA:  Cough, chills, and vomiting.  EXAM: CT ANGIOGRAPHY CHEST WITH CONTRAST TECHNIQUE: Multidetector CT imaging of the chest was performed using the standard protocol during bolus administration of intravenous contrast. Multiplanar CT image reconstructions and MIPs were obtained to evaluate the vascular anatomy. CONTRAST:  96mL OMNIPAQUE IOHEXOL 350 MG/ML SOLN COMPARISON:  Chest x-ray from same day. FINDINGS: Cardiovascular: Satisfactory opacification of the pulmonary arteries to the segmental level. No evidence of pulmonary embolism. Normal heart size. No pericardial effusion. No thoracic aortic aneurysm or dissection. Mediastinum/Nodes: No enlarged mediastinal, hilar, or axillary lymph nodes. Tiny subcentimeter hypodense nodules and calcifications in the thyroid gland. Not clinically significant; no follow-up imaging recommended. Trachea and esophagus demonstrate no significant findings. Lungs/Pleura: Upper lobe predominant smooth interlobular septal thickening. Upper lobe predominant patchy and confluent peribronchovascular ground-glass densities. Dependent atelectasis in both lower lobes. Small to moderate bilateral pleural effusions. No pneumothorax. Upper Abdomen: Small perihepatic ascites. Air anterior to the liver within largely decompressed transverse colon. Musculoskeletal: No chest wall abnormality. No acute or significant osseous findings. Review of the MIP images confirms the above findings. IMPRESSION: 1. No evidence of pulmonary embolism. 2. Upper lobe predominant smooth interlobular septal thickening with patchy and confluent peribronchovascular ground-glass densities, concerning for pulmonary edema, less likely atypical infection. 3. Small to moderate bilateral pleural effusions. 4. Small perihepatic ascites. Electronically Signed   By: Titus Dubin M.D.   On: 01/11/2020 15:18   ECHOCARDIOGRAM COMPLETE  Result Date: 01/11/2020    ECHOCARDIOGRAM REPORT   Patient Name:   Kristina Myers Date of Exam: 01/11/2020 Medical  Rec #:  [id number]                 Height:       62.0 in Accession #:    [id number]                Weight:       107.0 lb Date of Birth:  Apr 26, 1946                BSA:          1.465 m Patient Age:    72 years                  BP:           105/70 mmHg Patient Gender: F                         HR:           96 bpm. Exam Location:  Inpatient Procedure: 2D Echo Indications:    Dyspnea 786.09 / R06.00  History:        Patient has no prior history of Echocardiogram examinations.                 Risk Factors:Non-Smoker. Pneumonia.  Sonographer:    Leavy Cella Referring Phys: [id number] [place]  1. Left ventricular ejection fraction, by estimation, is 60 to 65%. The left ventricle has normal function. The left ventricle has no regional wall motion abnormalities. Left ventricular diastolic parameters are consistent with Grade I diastolic dysfunction (impaired relaxation).  2. Right ventricular systolic function is normal. The right ventricular size is normal.  3. The mitral valve is grossly normal. Trivial mitral valve regurgitation.  4. The aortic valve is tricuspid. Aortic valve regurgitation is not visualized.  5. The inferior vena cava is dilated in size with >50% respiratory variability, suggesting right atrial pressure of 8 mmHg. FINDINGS  Left Ventricle: Left ventricular ejection fraction, by estimation, is 60 to 65%. The left ventricle has normal function. The left ventricle has no regional wall motion abnormalities. The left ventricular internal cavity size was normal in size. There is  no left ventricular hypertrophy. Left ventricular diastolic parameters are consistent with Grade I diastolic dysfunction (impaired relaxation). Indeterminate filling pressures. Right Ventricle: The right ventricular size is normal. No increase in right ventricular wall thickness. Right ventricular systolic function is normal. Left Atrium: Left atrial size was normal in size. Right Atrium: Right atrial size was  normal in size. Pericardium: There is no evidence of pericardial effusion. Mitral Valve: The mitral valve is grossly normal. Trivial mitral valve regurgitation. Tricuspid Valve: The tricuspid valve is grossly normal. Tricuspid valve regurgitation is trivial. Aortic Valve: The aortic valve is tricuspid. Aortic valve regurgitation is not visualized. Pulmonic Valve: The pulmonic valve was normal in structure. Pulmonic valve regurgitation is not visualized. Aorta: The aortic root and ascending aorta are structurally normal, with no evidence of dilitation. Venous: The inferior vena cava is dilated in size with greater than 50% respiratory variability, suggesting right atrial pressure of 8 mmHg. IAS/Shunts: No atrial level shunt detected by color flow Doppler. Additional Comments: There is a small pleural effusion in the right lateral region. Mild ascites is present.  LEFT VENTRICLE PLAX 2D LVIDd:         4.80 cm  Diastology LVIDs:         3.40 cm  LV e' lateral:   8.05 cm/s LV PW:         0.80 cm  LV E/e' lateral: 10.2 LV IVS:        0.90 cm  LV e' medial:    8.16 cm/s LVOT diam:     1.90 cm  LV E/e' medial:  10.0 LVOT Area:     2.84 cm  RIGHT VENTRICLE RV S prime:     21.30 cm/s TAPSE (M-mode): 2.2 cm LEFT ATRIUM           Index LA diam:      2.40 cm 1.64 cm/m LA Vol (A2C): 49.8 ml 33.98 ml/m LA Vol (A4C): 39.2 ml 26.75 ml/m   AORTA Ao Root diam: 3.00 cm MITRAL VALVE MV Area (PHT): 5.27 cm    SHUNTS MV Decel Time: 144 msec    Systemic Diam: 1.90 cm MV E velocity: 82.00 cm/s MV A velocity: 42.70 cm/s MV E/A ratio:  1.92 Lyman Bishop MD Electronically signed by Lyman Bishop MD Signature Date/Time: 01/11/2020/4:49:56 PM    Final    US Abdomen Limited RUQ  Result Date: 01/11/2020 CLINICAL DATA:  Nausea and right upper quadrant pain since Thursday. EXAM: ULTRASOUND ABDOMEN LIMITED RIGHT UPPER QUADRANT COMPARISON:  None. FINDINGS: Gallbladder: Sludge. No gallstones. Asymmetric wall thickening and edema measuring up to  9 mm. No sonographic Murphy sign noted by sonographer. Common bile duct: Diameter: 5 mm, normal. Liver: 7 x 9 x 8 mm echogenic lesion in the right. Within normal limits in parenchymal echogenicity. Portal vein is patent on color Doppler imaging with normal direction of blood flow towards the liver. Other: Small perihepatic ascites.  Small right pleural effusion. IMPRESSION: 1. Gallbladder sludge with asymmetric wall thickening and edema. Given small perihepatic ascites, small right pleural effusion, and elevated LFTs, the gallbladder appearance is favored related to underlying liver disease, although acalculus cholecystitis is not entirely excluded. 2. Small 0.9 cm hyperechoic lesion in the right lobe of the liver, probably a hemangioma, benign. If definitive characterization is required, consider follow-up MRI abdomen without and with MultiHance in 6 months. Electronically Signed   By: Titus Dubin M.D.   On: 01/11/2020 12:42    ASSESSMENT / PLAN:  Acute Hypoxemic Respiratory Failure in setting of Bilateral Infiltrates Small to Moderate Bilateral Pleural Effusions CTA negative for PE but shows upper lobe predominant smooth interlobular septal thickening, with patchy peribronchovascular ground-glass densities.  DDx includes atypical PNA, aspiration with prior vomiting, rickettsial / tick borne disease given tick exposures, non-cardiogenic pulmonary edema. COVID, influenza, RVP, Legionella / Strep antigen negative. LVEF normal / grade I diastolic dysfunction.  Sister with history of RA.   -follow CXR intermittently  -wean O2 for saturations >90% -pulmonary hygiene - IS, mobilize -follow up tick related labs -discussed weaning O2 with RN  -adjust pulmicort dosing -duoneb > consider stopping 6/10 -if effusions not resolving 6/10, consider sampling fluid   Sepsis  Leukopenia Recent Tick Exposure  -continue doxycycline, ceftriaxone  -ID following, concern for possible RMSF -follow PCT    Hyponatremia  Hypophosphatemia  Serum Osm 263, Urine Osm 83, Urine Na <10.  -follow electrolytes, replace as indicated   Elevated AST/ALT -trend LFT's  -avoid hepatotoxic agents   Elevated Troponin  -Cardiology following, appreciate in put     Noe Gens, MSN, NP-C [facility] 01/13/2020, 8:59 AM   Please see [email or web address] for pager details.

## 2020-01-13 NOTE — Progress Notes (Signed)
RT instructed patient and family on the use of a flutter valve and the incentive spirometer.  Patient has been using the incentive spiromter and acknowledged she understood how it is used. Patient was able to demonstrate back good technique with the flutter valve.

## 2020-01-13 NOTE — Progress Notes (Signed)
[facility]   Reason for visit: Follow up on pneumonia  Interval History: she continues on oxygen by nasal cannula.  Still very sob.  Husband at bedside.  WBC now wnl.  Platelets improved some.  No rashes.  + headache.    Physical Exam: Constitutional:  Vitals:   01/13/20 0854 01/13/20 1028  BP: 101/65   Pulse: 89 86  Resp: (!) 26 20  Temp: 97.7 F (36.5 C)   SpO2: 97% 91%   patient appears in NAD Eyes: anicteric HENT: no thrush Respiratory: increased respiratory rate; speaking in full sentences.  Cardiovascular: tachy RR GI: soft, nt, nd  Review of Systems: Constitutional: negative for fevers and chills Respiratory: positive for cough or dyspnea on exertion Gastrointestinal: negative for nausea and diarrhea  Lab Results  Component Value Date   WBC 4.7 01/13/2020   HGB 12.7 01/13/2020   HCT 37.2 01/13/2020   MCV 92.5 01/13/2020   PLT 94 (L) 01/13/2020    Lab Results  Component Value Date   CREATININE 0.67 01/13/2020   BUN 10 01/13/2020   NA 129 (L) 01/13/2020   K 3.8 01/13/2020   CL 97 (L) 01/13/2020   CO2 24 01/13/2020    Lab Results  Component Value Date   ALT 225 (H) 01/13/2020   AST 472 (H) 01/13/2020   ALKPHOS 519 (H) 01/13/2020     Microbiology: Recent Results (from the past 240 hour(s))  SARS Coronavirus 2 by RT PCR (hospital order, performed in [facility] hospital lab) Nasopharyngeal Nasopharyngeal Swab     Status: None   Collection Time: 01/11/20 10:21 AM   Specimen: Nasopharyngeal Swab  Result Value Ref Range Status   SARS Coronavirus 2 NEGATIVE NEGATIVE Final    Comment: (NOTE) SARS-CoV-2 target nucleic acids are NOT DETECTED. The SARS-CoV-2 RNA is generally detectable in upper and lower respiratory specimens during the acute phase of infection. The lowest concentration of SARS-CoV-2 viral copies this assay can detect is 250 copies / mL. A negative result does not preclude SARS-CoV-2 infection and should not  be used as the sole basis for treatment or other patient management decisions.  A negative result may occur with improper specimen collection / handling, submission of specimen other than nasopharyngeal swab, presence of viral mutation(s) within the areas targeted by this assay, and inadequate number of viral copies (<250 copies / mL). A negative result must be combined with clinical observations, patient history, and epidemiological information. Fact Sheet for Patients:   StrictlyIdeas.no Fact Sheet for Healthcare Providers: BankingDealers.co.za This test is not yet approved or cleared  by the [place] FDA and has been authorized for detection and/or diagnosis of SARS-CoV-2 by FDA under an Emergency Use Authorization (EUA).  This EUA will remain in effect (meaning this test can be used) for the duration of the COVID-19 declaration under Section 564(b)(1) of the Act, 21 U.S.C. section 360bbb-3(b)(1), unless the authorization is terminated or revoked sooner. Performed at [facility], [place] [address] [place], [place] [address]   Respiratory Panel by PCR     Status: None   Collection Time: 01/11/20 10:21 AM   Specimen: Nasopharyngeal Swab; Respiratory  Result Value Ref Range Status   Adenovirus NOT DETECTED NOT DETECTED Final   Coronavirus 229E NOT DETECTED NOT DETECTED Final    Comment: (NOTE) The Coronavirus on the Respiratory Panel, DOES NOT test for the novel  Coronavirus (2019 nCoV)    Coronavirus HKU1 NOT DETECTED NOT DETECTED Final  Coronavirus NL63 NOT DETECTED NOT DETECTED Final   Coronavirus OC43 NOT DETECTED NOT DETECTED Final   Metapneumovirus NOT DETECTED NOT DETECTED Final   Rhinovirus / Enterovirus NOT DETECTED NOT DETECTED Final   Influenza A NOT DETECTED NOT DETECTED Final   Influenza B NOT DETECTED NOT DETECTED Final   Parainfluenza Virus 1 NOT DETECTED NOT DETECTED Final   Parainfluenza Virus 2 NOT  DETECTED NOT DETECTED Final   Parainfluenza Virus 3 NOT DETECTED NOT DETECTED Final   Parainfluenza Virus 4 NOT DETECTED NOT DETECTED Final   Respiratory Syncytial Virus NOT DETECTED NOT DETECTED Final   Bordetella pertussis NOT DETECTED NOT DETECTED Final   Chlamydophila pneumoniae NOT DETECTED NOT DETECTED Final   Mycoplasma pneumoniae NOT DETECTED NOT DETECTED Final    Comment: Performed at [facility], [place] [address], [place],  [address]  Resp Panel by RT PCR (RSV, Flu A&B, Covid) - Nasopharyngeal Swab     Status: None   Collection Time: 01/11/20  4:34 PM   Specimen: Nasopharyngeal Swab  Result Value Ref Range Status   SARS Coronavirus 2 by RT PCR NEGATIVE NEGATIVE Final    Comment: (NOTE) SARS-CoV-2 target nucleic acids are NOT DETECTED. The SARS-CoV-2 RNA is generally detectable in upper respiratoy specimens during the acute phase of infection. The lowest concentration of SARS-CoV-2 viral copies this assay can detect is 131 copies/mL. A negative result does not preclude SARS-Cov-2 infection and should not be used as the sole basis for treatment or other patient management decisions. A negative result may occur with  improper specimen collection/handling, submission of specimen other than nasopharyngeal swab, presence of viral mutation(s) within the areas targeted by this assay, and inadequate number of viral copies (<131 copies/mL). A negative result must be combined with clinical observations, patient history, and epidemiological information. The expected result is Negative. Fact Sheet for Patients:  PinkCheek.be Fact Sheet for Healthcare Providers:  GravelBags.it This test is not yet ap proved or cleared by the [place] FDA and  has been authorized for detection and/or diagnosis of SARS-CoV-2 by FDA under an Emergency Use Authorization (EUA). This EUA will remain  in effect (meaning this test can be  used) for the duration of the COVID-19 declaration under Section 564(b)(1) of the Act, 21 U.S.C. section 360bbb-3(b)(1), unless the authorization is terminated or revoked sooner.    Influenza A by PCR NEGATIVE NEGATIVE Final   Influenza B by PCR NEGATIVE NEGATIVE Final    Comment: (NOTE) The Xpert Xpress SARS-CoV-2/FLU/RSV assay is intended as an aid in  the diagnosis of influenza from Nasopharyngeal swab specimens and  should not be used as a sole basis for treatment. Nasal washings and  aspirates are unacceptable for Xpert Xpress SARS-CoV-2/FLU/RSV  testing. Fact Sheet for Patients: PinkCheek.be Fact Sheet for Healthcare Providers: GravelBags.it This test is not yet approved or cleared by the [place] FDA and  has been authorized for detection and/or diagnosis of SARS-CoV-2 by  FDA under an Emergency Use Authorization (EUA). This EUA will remain  in effect (meaning this test can be used) for the duration of the  Covid-19 declaration under Section 564(b)(1) of the Act, 21  U.S.C. section 360bbb-3(b)(1), unless the authorization is  terminated or revoked.    Respiratory Syncytial Virus by PCR NEGATIVE NEGATIVE Final    Comment: (NOTE) Fact Sheet for Patients: PinkCheek.be Fact Sheet for Healthcare Providers: GravelBags.it This test is not yet approved or cleared by the [place] and  has been authorized  for detection and/or diagnosis of SARS-CoV-2 by  FDA under an Emergency Use Authorization (EUA). This EUA will remain  in effect (meaning this test can be used) for the duration of the  COVID-19 declaration under Section 564(b)(1) of the Act, 21 U.S.C.  section 360bbb-3(b)(1), unless the authorization is terminated or  revoked. Performed at [facility], [place] [address] [place], [place] [address]   MRSA PCR Screening     Status: None    Collection Time: 01/12/20 10:42 AM   Specimen: Nasal Mucosa; Nasopharyngeal  Result Value Ref Range Status   MRSA by PCR NEGATIVE NEGATIVE Final    Comment:        The GeneXpert MRSA Assay (FDA approved for NASAL specimens only), is one component of a comprehensive MRSA colonization surveillance program. It is not intended to diagnose MRSA infection nor to guide or monitor treatment for MRSA infections. Performed at [facility], [place] [address], [place], [place] [address]     Impression/Plan:  1. Acute hypoxic respiratory failure - bilateral infiltrates and effusions, non-cardiogenic pulmonary edema.  Remains on nasal cannula.   Differential includes bacterial, atypical, viral, RMSF Respiratory panel negative.   Continue with supportive care, antibiotics.  Sampling if no improvement.    2.   Transaminitis - ultrasound without significant gallbladder disease.  Most likely c/w liver pathology.  Likely from #1, RMSF vs other.  Improved today.  Will continue to monitor.  3.  Leukopenia - improved to normal. Platelets also increasing.

## 2020-01-13 NOTE — Progress Notes (Addendum)
Progress Note  Patient Name: Kristina Myers Date of Encounter: 01/13/2020  Primary Cardiologist: No primary care provider on file.   Subjective   Still SOB.  On HFNC O2 at 11L with O2 sats in the high 90's.  Maintaining NSR  Inpatient Medications    Scheduled Meds: . aspirin EC  81 mg Oral Daily  . benzonatate  200 mg Oral TID  . budesonide (PULMICORT) nebulizer solution  0.25 mg Nebulization BID  . calcium carbonate  1 tablet Oral Q breakfast  . ipratropium-albuterol  3 mL Nebulization QID  . multivitamin with minerals  1 tablet Oral Daily   Continuous Infusions: . cefTRIAXone (ROCEPHIN)  IV 1 g (01/13/20 0831)  . doxycycline (VIBRAMYCIN) IV 100 mg (01/12/20 2154)   PRN Meds: acetaminophen **OR** acetaminophen, ipratropium-albuterol, metoprolol tartrate, ondansetron **OR** ondansetron (ZOFRAN) IV, oxyCODONE   Vital Signs    Vitals:   01/12/20 1949 01/12/20 2330 01/13/20 0329 01/13/20 0735  BP:  108/73 104/69   Pulse:    85  Resp:  (!) 24  (!) 24  Temp:  99.5 F (37.5 C) 98.5 F (36.9 C)   TempSrc:  Oral Oral   SpO2: 94%   99%  Weight:      Height:        Intake/Output Summary (Last 24 hours) at 01/13/2020 0914 Last data filed at 01/12/2020 2154 Gross per 24 hour  Intake 690 ml  Output 775 ml  Net -85 ml   Filed Weights   01/11/20 0822  Weight: 48.5 kg    Telemetry    NSR - Personally Reviewed  ECG     no new EKG to review- Personally Reviewed  Physical Exam   GEN: Well nourished, well developed in no acute distress HEENT: Normal NECK: No JVD; No carotid bruits LYMPHATICS: No lymphadenopathy CARDIAC:RRR, no murmurs, rubs, gallops RESPIRATORY:diffuse crackles ABDOMEN: Soft, non-tender, non-distended MUSCULOSKELETAL:  No edema; No deformity  SKIN: Warm and dry NEUROLOGIC:  Alert and oriented x 3 PSYCHIATRIC:  Normal affect    Labs    Chemistry Recent Labs  Lab 01/11/20 0832 01/12/20 0649 01/13/20 0300  NA 120* 129* 129*  K  3.9 3.4* 3.8  CL 89* 95* 97*  CO2 24 22 24   GLUCOSE 119* 135* 125*  BUN 8 9 10   CREATININE 0.62 0.66 0.67  CALCIUM 7.8* 7.6* 7.3*  PROT 5.9* 5.6* 5.1*  ALBUMIN 3.1* 2.8* 2.5*  AST 908* 751* 472*  ALT 349* 319* 225*  ALKPHOS 489* 644* 519*  BILITOT 0.8 0.9 0.8  GFRNONAA >60 >60 >60  GFRAA >60 >60 >60  ANIONGAP 7 12 8      Hematology Recent Labs  Lab 01/11/20 1044 01/12/20 0649 01/13/20 0300  WBC 1.9* 2.9* 4.7  RBC 4.00 4.35 4.02  HGB 12.8 13.7 12.7  HCT 36.8 40.3 37.2  MCV 92.0 92.6 92.5  MCH 32.0 31.5 31.6  MCHC 34.8 34.0 34.1  RDW 11.9 12.2 12.4  PLT 63* 75* 94*    Cardiac EnzymesNo results for input(s): TROPONINI in the last 168 hours. No results for input(s): TROPIPOC in the last 168 hours.   BNP Recent Labs  Lab 01/11/20 1312  BNP 66.5     DDimer  Recent Labs  Lab 01/11/20 1044  DDIMER 7.03*     Radiology    DG Chest 1 View  Result Date: 01/12/2020 CLINICAL DATA:  Pneumonia, increasing work of breathing EXAM: CHEST  1 VIEW COMPARISON:  CT and radiograph 01/11/2020 FINDINGS: Increasing airspace  opacity in the mid to upper lungs and right lung base. Bilateral pleural effusions are again seen. Background of coarse reticular interstitial opacity throughout both lungs. Cardiomediastinal contours are stable. The aorta is calcified. The remaining cardiomediastinal contours are unremarkable. No acute osseous or soft tissue abnormality. Degenerative changes are present in the imaged spine and shoulders. Telemetry leads overlie the chest. IMPRESSION: Increasing airspace opacity in the mid to upper lungs and right lung base which could reflect edema and/or infection. Similar distribution to the recent CT. Bilateral effusions. Aortic Atherosclerosis (ICD10-I70.0). Electronically Signed   By: Lovena Le M.D.   On: 01/12/2020 06:00   CT Angio Chest PE W and/or Wo Contrast  Result Date: 01/11/2020 CLINICAL DATA:  Cough, chills, and vomiting. EXAM: CT ANGIOGRAPHY CHEST  WITH CONTRAST TECHNIQUE: Multidetector CT imaging of the chest was performed using the standard protocol during bolus administration of intravenous contrast. Multiplanar CT image reconstructions and MIPs were obtained to evaluate the vascular anatomy. CONTRAST:  86mL OMNIPAQUE IOHEXOL 350 MG/ML SOLN COMPARISON:  Chest x-ray from same day. FINDINGS: Cardiovascular: Satisfactory opacification of the pulmonary arteries to the segmental level. No evidence of pulmonary embolism. Normal heart size. No pericardial effusion. No thoracic aortic aneurysm or dissection. Mediastinum/Nodes: No enlarged mediastinal, hilar, or axillary lymph nodes. Tiny subcentimeter hypodense nodules and calcifications in the thyroid gland. Not clinically significant; no follow-up imaging recommended. Trachea and esophagus demonstrate no significant findings. Lungs/Pleura: Upper lobe predominant smooth interlobular septal thickening. Upper lobe predominant patchy and confluent peribronchovascular ground-glass densities. Dependent atelectasis in both lower lobes. Small to moderate bilateral pleural effusions. No pneumothorax. Upper Abdomen: Small perihepatic ascites. Air anterior to the liver within largely decompressed transverse colon. Musculoskeletal: No chest wall abnormality. No acute or significant osseous findings. Review of the MIP images confirms the above findings. IMPRESSION: 1. No evidence of pulmonary embolism. 2. Upper lobe predominant smooth interlobular septal thickening with patchy and confluent peribronchovascular ground-glass densities, concerning for pulmonary edema, less likely atypical infection. 3. Small to moderate bilateral pleural effusions. 4. Small perihepatic ascites. Electronically Signed   By: Titus Dubin M.D.   On: 01/11/2020 15:18   ECHOCARDIOGRAM COMPLETE  Result Date: 01/11/2020    ECHOCARDIOGRAM REPORT   Patient Name:   Kristina Myers Date of Exam: 01/11/2020 Medical Rec #:  [id number]                  Height:       62.0 in Accession #:    [id number]                Weight:       107.0 lb Date of Birth:  Jul 23, 1946                BSA:          1.465 m Patient Age:    74 years                  BP:           105/70 mmHg Patient Gender: F                         HR:           96 bpm. Exam Location:  Inpatient Procedure: 2D Echo Indications:    Dyspnea 786.09 / R06.00  History:        Patient has no prior history of Echocardiogram examinations.  Risk Factors:Non-Smoker. Pneumonia.  Sonographer:    Leavy Cella Referring Phys: [id number] [place]  1. Left ventricular ejection fraction, by estimation, is 60 to 65%. The left ventricle has normal function. The left ventricle has no regional wall motion abnormalities. Left ventricular diastolic parameters are consistent with Grade I diastolic dysfunction (impaired relaxation).  2. Right ventricular systolic function is normal. The right ventricular size is normal.  3. The mitral valve is grossly normal. Trivial mitral valve regurgitation.  4. The aortic valve is tricuspid. Aortic valve regurgitation is not visualized.  5. The inferior vena cava is dilated in size with >50% respiratory variability, suggesting right atrial pressure of 8 mmHg. FINDINGS  Left Ventricle: Left ventricular ejection fraction, by estimation, is 60 to 65%. The left ventricle has normal function. The left ventricle has no regional wall motion abnormalities. The left ventricular internal cavity size was normal in size. There is  no left ventricular hypertrophy. Left ventricular diastolic parameters are consistent with Grade I diastolic dysfunction (impaired relaxation). Indeterminate filling pressures. Right Ventricle: The right ventricular size is normal. No increase in right ventricular wall thickness. Right ventricular systolic function is normal. Left Atrium: Left atrial size was normal in size. Right Atrium: Right atrial size was normal in size. Pericardium:  There is no evidence of pericardial effusion. Mitral Valve: The mitral valve is grossly normal. Trivial mitral valve regurgitation. Tricuspid Valve: The tricuspid valve is grossly normal. Tricuspid valve regurgitation is trivial. Aortic Valve: The aortic valve is tricuspid. Aortic valve regurgitation is not visualized. Pulmonic Valve: The pulmonic valve was normal in structure. Pulmonic valve regurgitation is not visualized. Aorta: The aortic root and ascending aorta are structurally normal, with no evidence of dilitation. Venous: The inferior vena cava is dilated in size with greater than 50% respiratory variability, suggesting right atrial pressure of 8 mmHg. IAS/Shunts: No atrial level shunt detected by color flow Doppler. Additional Comments: There is a small pleural effusion in the right lateral region. Mild ascites is present.  LEFT VENTRICLE PLAX 2D LVIDd:         4.80 cm  Diastology LVIDs:         3.40 cm  LV e' lateral:   8.05 cm/s LV PW:         0.80 cm  LV E/e' lateral: 10.2 LV IVS:        0.90 cm  LV e' medial:    8.16 cm/s LVOT diam:     1.90 cm  LV E/e' medial:  10.0 LVOT Area:     2.84 cm  RIGHT VENTRICLE RV S prime:     21.30 cm/s TAPSE (M-mode): 2.2 cm LEFT ATRIUM           Index LA diam:      2.40 cm 1.64 cm/m LA Vol (A2C): 49.8 ml 33.98 ml/m LA Vol (A4C): 39.2 ml 26.75 ml/m   AORTA Ao Root diam: 3.00 cm MITRAL VALVE MV Area (PHT): 5.27 cm    SHUNTS MV Decel Time: 144 msec    Systemic Diam: 1.90 cm MV E velocity: 82.00 cm/s MV A velocity: 42.70 cm/s MV E/A ratio:  1.92 Lyman Bishop MD Electronically signed by Lyman Bishop MD Signature Date/Time: 01/11/2020/4:49:56 PM    Final    US Abdomen Limited RUQ  Result Date: 01/11/2020 CLINICAL DATA:  Nausea and right upper quadrant pain since Thursday. EXAM: ULTRASOUND ABDOMEN LIMITED RIGHT UPPER QUADRANT COMPARISON:  None. FINDINGS: Gallbladder: Sludge. No gallstones. Asymmetric wall thickening and edema measuring  up to 9 mm. No sonographic Murphy  sign noted by sonographer. Common bile duct: Diameter: 5 mm, normal. Liver: 7 x 9 x 8 mm echogenic lesion in the right. Within normal limits in parenchymal echogenicity. Portal vein is patent on color Doppler imaging with normal direction of blood flow towards the liver. Other: Small perihepatic ascites.  Small right pleural effusion. IMPRESSION: 1. Gallbladder sludge with asymmetric wall thickening and edema. Given small perihepatic ascites, small right pleural effusion, and elevated LFTs, the gallbladder appearance is favored related to underlying liver disease, although acalculus cholecystitis is not entirely excluded. 2. Small 0.9 cm hyperechoic lesion in the right lobe of the liver, probably a hemangioma, benign. If definitive characterization is required, consider follow-up MRI abdomen without and with MultiHance in 6 months. Electronically Signed   By: Titus Dubin M.D.   On: 01/11/2020 12:42    Cardiac Studies   2D echo 01/09/2020 IMPRESSIONS    1. Left ventricular ejection fraction, by estimation, is 60 to 65%. The  left ventricle has normal function. The left ventricle has no regional  wall motion abnormalities. Left ventricular diastolic parameters are  consistent with Grade I diastolic  dysfunction (impaired relaxation).  2. Right ventricular systolic function is normal. The right ventricular  size is normal.  3. The mitral valve is grossly normal. Trivial mitral valve  regurgitation.  4. The aortic valve is tricuspid. Aortic valve regurgitation is not  visualized.  5. The inferior vena cava is dilated in size with >50% respiratory  variability, suggesting right atrial pressure of 8 mmHg.   Patient Profile     74 y.o. female with no significant PMH who is being seen today for the evaluation of SOB at the request of Wynetta Fines, MD.  [place]    1. Acute respiratory failure with hypoxia -started after developing what sounds like a respiratory illness with  chills, cough, N/V -now with hypoxia>>lung exam appears worse this am and now on 10L O2 to maintain O2 sats > 88% -CCM following and concerned over possible Legionella due to coexisting, hepatic transaminitis, hyponatremia -continue antibx -BNP is normal  -chest CT neg for PE but does show bilateral pleural effusions, pulmonary edema or infection>>in setting of normal BNp in a thin patient, suspect this is not CHF -I am concerned that patient may have tick borne illness given labs and other sx and tick exposure in the past week  -may have noncardiogenic pulmonary edema from RMSF -O2 sats stable on 11L HFNC -pulmonary following  2.  Elevated troponin -hsTrop mildly elevated with flat trend ([id number]) -EKG is nonischemic and she has no CRFs except advanced age.  She has never smoked and has no fm hx of CAD -2D echo with normal LVF -this is consistent with demand ischemia -no further workup for ischemia indicated at this time  3.  Hyponatremia -Na 120 on admit and improved to 129 -suspect related to underlying pulmonary issue and possible tick borne illness -per CCM  4.  Hepatic transaminitis  -likely related to acute viral/bacterial syndrome>>again likely RMSF with recent tick exposure -LFTs slightly improved yesterday and trending up again today -continue to follow  5.  Leukopenic/Thrombocytopenic -? Related to bone marrow suppression from acute infection -WBC up to 4.7 today and plt ct slightly improved at 94K -need to consider tick borne diseases as well given her elevated LFTs, hyponatremia, bone marrow suppression, N/V/diarrhea and body aches -antibx coverage broadened with Doxycycline -appreciate ID input  6.  SVT -possible  atrial flutter with RVR vs. Sinus tachycardia with long 1st degree AV block -suspect driven by underlying respiratory illness -back in NSR  I have spent a total of 35 minutes with patient reviewing 2D echo, discussing case with TRH and Pulmonary ,  telemetry, EKGs, labs and examining patient as well as establishing an assessment and plan that was discussed with the patient.  > 50% of time was spent in direct patient care.    For questions or updates, please contact [place] Please consult [email or web address] for contact info under Cardiology/STEMI.      Signed, Fransico Him, MD  01/13/2020, 9:14 AM

## 2020-01-13 NOTE — Progress Notes (Signed)
Pt O2 saturations have fluctuated this shift between 83-100%. Pt started on 11LPM of O2 this am at 100% slowly decreased O2 requirements. Pt was asleep around 3pm noted O2 saturation decrease to 83% on 2LPM, woke pt up  Increasaed O2 to 8LPM, pt recovered to 95%.Pt out of bed into chair and saturations 100% decreased O2. Pt remains at 95% on 4LPM.

## 2020-01-14 ENCOUNTER — Inpatient Hospital Stay (HOSPITAL_COMMUNITY): Payer: Medicare Other

## 2020-01-14 DIAGNOSIS — J811 Chronic pulmonary edema: Secondary | ICD-10-CM

## 2020-01-14 LAB — COMPREHENSIVE METABOLIC PANEL
ALT: 178 U/L — ABNORMAL HIGH (ref 0–44)
AST: 345 U/L — ABNORMAL HIGH (ref 15–41)
Albumin: 2.3 g/dL — ABNORMAL LOW (ref 3.5–5.0)
Alkaline Phosphatase: 379 U/L — ABNORMAL HIGH (ref 38–126)
Anion gap: 6 (ref 5–15)
BUN: 6 mg/dL — ABNORMAL LOW (ref 8–23)
CO2: 25 mmol/L (ref 22–32)
Calcium: 7.3 mg/dL — ABNORMAL LOW (ref 8.9–10.3)
Chloride: 95 mmol/L — ABNORMAL LOW (ref 98–111)
Creatinine, Ser: 0.43 mg/dL — ABNORMAL LOW (ref 0.44–1.00)
GFR calc Af Amer: >60 mL/min (ref >60)
GFR calc non Af Amer: >60 mL/min (ref >60)
Glucose, Bld: 107 mg/dL — ABNORMAL HIGH (ref 70–99)
Potassium: 3.7 mmol/L (ref 3.5–5.1)
Sodium: 126 mmol/L — ABNORMAL LOW (ref 135–145)
Total Bilirubin: 0.8 mg/dL (ref 0.3–1.2)
Total Protein: 4.8 g/dL — ABNORMAL LOW (ref 6.5–8.1)

## 2020-01-14 LAB — CBC
HCT: 35.4 % — ABNORMAL LOW (ref 36.0–46.0)
Hemoglobin: 12 g/dL (ref 12.0–15.0)
MCH: 31.1 pg (ref 26.0–34.0)
MCHC: 33.9 g/dL (ref 30.0–36.0)
MCV: 91.7 fL (ref 80.0–100.0)
Platelets: 125 10*3/uL — ABNORMAL LOW (ref 150–400)
RBC: 3.86 MIL/uL — ABNORMAL LOW (ref 3.87–5.11)
RDW: 12.6 % (ref 11.5–15.5)
WBC: 5.5 10*3/uL (ref 4.0–10.5)
nRBC: 0 % (ref 0.0–0.2)

## 2020-01-14 LAB — EHRLICHIA ANTIBODY PANEL
E chaffeensis (HGE) Ab, IgG: NEGATIVE
E chaffeensis (HGE) Ab, IgM: NEGATIVE
E. Chaffeensis (HME) IgM Titer: NEGATIVE
E.Chaffeensis (HME) IgG: NEGATIVE

## 2020-01-14 LAB — TROPONIN I (HIGH SENSITIVITY): Troponin I (High Sensitivity): 638 ng/L (ref <18)

## 2020-01-14 LAB — MISC LABCORP TEST (SEND OUT): Labcorp test code: 138412

## 2020-01-14 LAB — PATHOLOGIST SMEAR REVIEW

## 2020-01-14 LAB — ROCKY MTN SPOTTED FVR ABS PNL(IGG+IGM)
RMSF IgG: NEGATIVE
RMSF IgM: 0.2 index (ref 0.00–0.89)

## 2020-01-14 LAB — LYME DISEASE DNA BY PCR(BORRELIA BURG): Lyme Disease(B.burgdorferi)PCR: NEGATIVE

## 2020-01-14 LAB — SEDIMENTATION RATE: Sed Rate: 10 mm/hr (ref 0–22)

## 2020-01-14 MED ORDER — PREDNISONE 20 MG PO TABS
40.0000 mg | ORAL_TABLET | Freq: Every day | ORAL | Status: DC
  Administered 2020-01-14 – 2020-01-15 (×2): 40 mg via ORAL
  Filled 2020-01-14 (×2): qty 2

## 2020-01-14 MED ORDER — IBUPROFEN 200 MG PO TABS
400.0000 mg | ORAL_TABLET | Freq: Four times a day (QID) | ORAL | Status: DC | PRN
  Administered 2020-01-15 – 2020-01-17 (×2): 400 mg via ORAL
  Filled 2020-01-14 (×4): qty 2

## 2020-01-14 NOTE — Progress Notes (Addendum)
Progress Note  Patient Name: Kristina Myers Date of Encounter: 01/14/2020  Primary Cardiologist: No primary care provider on file.   Subjective   Remains SOB and still requiring O2 at 4L to maintain O2 sats >4L  Inpatient Medications    Scheduled Meds: . benzonatate  200 mg Oral TID  . budesonide (PULMICORT) nebulizer solution  0.5 mg Nebulization BID  . calcium carbonate  1 tablet Oral Q breakfast  . multivitamin with minerals  1 tablet Oral Daily   Continuous Infusions: . cefTRIAXone (ROCEPHIN)  IV 1 g (01/14/20 0904)  . doxycycline (VIBRAMYCIN) IV 100 mg (01/14/20 0905)   PRN Meds: acetaminophen **OR** acetaminophen, ipratropium-albuterol, ondansetron **OR** ondansetron (ZOFRAN) IV, oxyCODONE, polyvinyl alcohol   Vital Signs    Vitals:   01/14/20 0332 01/14/20 0400 01/14/20 0725 01/14/20 0727  BP: (!) 88/61   95/63  Pulse: 81 79  80  Resp: (!) 22 19  (!) 21  Temp: 97.8 F (36.6 C)   98.8 F (37.1 C)  TempSrc: Oral   Oral  SpO2:  90% 92% 92%  Weight:      Height:        Intake/Output Summary (Last 24 hours) at 01/14/2020 1610 Last data filed at 01/14/2020 0505 Gross per 24 hour  Intake 850 ml  Output 900 ml  Net -50 ml   Filed Weights   01/11/20 9604  Weight: 48.5 kg    Telemetry    NSR- Personally Reviewed  ECG  No new EKG to review- Personally Reviewed  Physical Exam   GEN: Well nourished, well developed in no acute distress HEENT: Normal NECK: No JVD; No carotid bruits LYMPHATICS: No lymphadenopathy CARDIAC:RRR, no murmurs, rubs, gallops RESPIRATORY:  Crackles at bases ABDOMEN: Soft, non-tender, non-distended MUSCULOSKELETAL:  No edema; No deformity  SKIN: Warm and dry NEUROLOGIC:  Alert and oriented x 3 PSYCHIATRIC:  Normal affect    Labs    Chemistry Recent Labs  Lab 01/12/20 0649 01/13/20 0300 01/14/20 0425  NA 129* 129* 126*  K 3.4* 3.8 3.7  CL 95* 97* 95*  CO2 22 24 25   GLUCOSE 135* 125* 107*  BUN 9 10 6*    CREATININE 0.66 0.67 0.43*  CALCIUM 7.6* 7.3* 7.3*  PROT 5.6* 5.1* 4.8*  ALBUMIN 2.8* 2.5* 2.3*  AST 751* 472* 345*  ALT 319* 225* 178*  ALKPHOS 644* 519* 379*  BILITOT 0.9 0.8 0.8  GFRNONAA >60 >60 >60  GFRAA >60 >60 >60  ANIONGAP 12 8 6      Hematology Recent Labs  Lab 01/12/20 0649 01/13/20 0300 01/14/20 0425  WBC 2.9* 4.7 5.5  RBC 4.35 4.02 3.86*  HGB 13.7 12.7 12.0  HCT 40.3 37.2 35.4*  MCV 92.6 92.5 91.7  MCH 31.5 31.6 31.1  MCHC 34.0 34.1 33.9  RDW 12.2 12.4 12.6  PLT 75* 94* 125*    Cardiac EnzymesNo results for input(s): TROPONINI in the last 168 hours. No results for input(s): TROPIPOC in the last 168 hours.   BNP Recent Labs  Lab 01/11/20 1312  BNP 66.5     DDimer  Recent Labs  Lab 01/11/20 1044  DDIMER 7.03*     Radiology    DG Chest Port 1 View  Result Date: 01/13/2020 CLINICAL DATA:  Respiratory distress EXAM: PORTABLE CHEST 1 VIEW COMPARISON:  January 12, 2020 FINDINGS: There is progression of consolidation in the right upper lobe compared to 1 day prior. There is stable airspace opacity throughout much of the left upper  lobe as well as in the right base. There are pleural effusions bilaterally, similar to 1 day prior. Heart size normal. No adenopathy evident. Pulmonary vascularity within normal limits. No bone lesions. IMPRESSION: Multifocal airspace opacity with increase in consolidation in the right upper lobe. Other areas of airspace opacity appear stable compared to 1 day prior. Small pleural effusions bilaterally. Stable cardiac silhouette. Electronically Signed   By: Lowella Grip III M.D.   On: 01/13/2020 09:28    Cardiac Studies   2D echo 01/09/2020 IMPRESSIONS    1. Left ventricular ejection fraction, by estimation, is 60 to 65%. The  left ventricle has normal function. The left ventricle has no regional  wall motion abnormalities. Left ventricular diastolic parameters are  consistent with Grade I diastolic  dysfunction (impaired  relaxation).  2. Right ventricular systolic function is normal. The right ventricular  size is normal.  3. The mitral valve is grossly normal. Trivial mitral valve  regurgitation.  4. The aortic valve is tricuspid. Aortic valve regurgitation is not  visualized.  5. The inferior vena cava is dilated in size with >50% respiratory  variability, suggesting right atrial pressure of 8 mmHg.   Patient Profile     74 y.o. female with no significant PMH who is being seen today for the evaluation of SOB at the request of Wynetta Fines, MD.  [place]    1. Acute respiratory failure with hypoxia -started after developing what sounds like a respiratory illness with chills, cough, N/V -now with hypoxia>>O2 has been weaned down to 4L with O2 sats 90% -Legionella ruled out -BNP is normal  -chest CT neg for PE but does show bilateral pleural effusions, pulmonary edema or infection>>in setting of normal BNp in a thin patient, suspect this is not CHF and is non cardiogenic pulmonary edema -? tick borne illness given labs and other sx and tick exposure in the past week  -RMSF negative and still await Erlichia panel -pulmonary following  2.  Elevated troponin -hsTrop mildly elevated with fiarly flat trend ([id number]) -EKG is nonischemic and she has no CRFs except advanced age.  She has never smoked and has no fm hx of CAD -2D echo with normal LVF -this is consistent with demand ischemia -no further workup for ischemia indicated at this time  3.  Hyponatremia -Na 120 on admit and was up to 129 yesterday but down to 126 today -suspect related to underlying pulmonary issue and possible tick borne illness -per CCM  4.  Hepatic transaminitis  -likely related to acute viral/bacterial syndrome>>again with recent tick exposure suspect possible rickettsial disease -LFTs slightly improved yesterday  -continue to follow  5.  Leukopenic/Thrombocytopenic -? Related to bone marrow suppression  from acute infection -WBC up to 5.5 today and plt ct slightly improved at 125K -tick borne diseases still suspected given her elevated LFTs, hyponatremia, bone marrow suppression, N/V/diarrhea and body aches -antibx coverage broadened with Doxycycline -appreciate ID input  6.  SVT -had more atrial tach with RVR on tele -suspect driven by underlying respiratory illness -back in NSR currently -BP too soft to add BB or CCB -continue to monitor on tele  For questions or updates, please contact [place] Please consult [email or web address] for contact info under Cardiology/STEMI.      Signed, Fransico Him, MD  01/14/2020, 9:07 AM

## 2020-01-14 NOTE — Plan of Care (Signed)

## 2020-01-14 NOTE — Progress Notes (Signed)
PROGRESS NOTE    Kristina Myers  MWN:[id number] DOB: [birth year] DOA: 01/11/2020 PCP: Kelton Pillar, MD      Brief Narrative:  Kristina Myers is a 74 y.o. F with no significant PMHx who presented with dry cough, chills, headache.  Her symptoms progressively worsened until she was short of breath so she came to the ER.  In the ER, chest x-ray showed bilateral infiltrates, WBC 2.1, platelets 65, AST 908 and sodium 120.        Assessment & Plan:  Leukopenia, hyponatremia, transaminitis, thrombocytopenia --> human monocytic ehrlichiosis Patient recently spent a few weeks at her vacation home at [facility] in [place].  During that time she was gardening, found several ticks on her.  This pattern of leukopenia, hyponatremia, transaminitis, and thrombocytopenia is typical for tickborne disease.  Thin smear unremarkable. Borrelia, [place] spotted fever, Ehrlichia IgG and IgM antibodies all negative, Ehrlichia chaffeensis PCR on blood positive.   -Continue doxycycline, day 3 of 7 -Consult ID, appreciate expert guidance    Acute respiratory failure with hypoxia The differential here seems to favor ARDS in setting of tickborne illness, less likely Pulmonary edema or sepsis from pneumonia although atypical bacterial and viral pneumonias are on the differential.    Required 15L two days ago, now weaned to 4L.   No response to furosemide, doubt pulmonary edema is contributing. Echo with mild diastolic dysfunction, normal EF. I would defer Pulmicort, duo nebs, ceftriaxone, Tessalon to pulmonology and ID  -Consult Pulmonlogy, appreciate cares  Elevated troponin Demand ishcemia, no further ischemic work up necessary, ACS ruled out.  Peaked at 60. -Consult cardiology, appreciate recommendations   SVT Brief, nonsustained.    Liver cyst -Repeat US in 6 months          Disposition: Status is: Inpatient  Remains inpatient appropriate because:still  requiring large amounts supplemetnal o2,    Dispo: The patient is from: Home              Anticipated d/c is to: Home              Anticipated d/c date is: 3 days              Patient currently is not medically stable to d/c.              MDM: The below labs and imaging reports reviewed and summarized above.  Medication management as above.     DVT prophylaxis: SCDs Code Status: FULL Family Communication: Husband and daughter at bedside    Consultants:   Pulm  Cards  ID  Procedures:   Echo  Antimicrobials:   Doxycycline 6/8 >>  Ceftriaxone 6/7 >>    Culture data:              Subjective: Patient remains tired.  No further fever.  No further confusion.  No rash has developed.     Objective: Vitals:   01/14/20 0725 01/14/20 0727 01/14/20 1145 01/14/20 1601  BP:  95/63 112/82 116/80  Pulse:  80 87 91  Resp:  (!) 21 (!) 24 (!) 25  Temp:  98.8 F (37.1 C) 97.9 F (36.6 C) (!) 97.5 F (36.4 C)  TempSrc:  Oral Oral   SpO2: 92% 92% 97% 100%  Weight:      Height:        Intake/Output Summary (Last 24 hours) at 01/14/2020 1641 Last data filed at 01/14/2020 1600 Gross per 24 hour  Intake 700 ml  Output 900 ml  Net -200 ml   Filed Weights   01/11/20 0822  Weight: 48.5 kg    Examination: General appearance: Thin elderly adult female, lying in bed, appears tired.     HEENT: Anicteric, conjunctive pink, lids and lashes appear mostly normal.  No nasal deformity, discharge.  She has scant epistaxis, dried.,  Dentition in good repair, oropharynx moist, no oral lesions, hearing diminished. Skin: Warm and dry, no suspicious rashes of the face, neck, abdomen, trunk, arms, wrists, hands, legs, ankles. Cardiac: Tachycardic, regular, no murmurs, JVP normal, no lower extremity edema. Respiratory: Tachypneic, shallow, rales persist. Abdomen: Abdomen soft, no tenderness palpation or guarding. MSK: Normal muscle bulk and tone. Neuro: Awake and  alert, extraocular movements intact, moves all extremities severe generalized weakness, speech fluent. Psych: Sensorium intact responding questions, attention normal, affect blunted, judgment insight appear normal.     Data Reviewed: I have personally reviewed following labs and imaging studies:  CBC: Recent Labs  Lab 01/11/20 0832 01/11/20 1044 01/12/20 0649 01/13/20 0300 01/14/20 0425  WBC 2.1* 1.9* 2.9* 4.7 5.5  NEUTROABS  --  1.5*  --  3.4  --   HGB 12.7 12.8 13.7 12.7 12.0  HCT 37.0 36.8 40.3 37.2 35.4*  MCV 91.8 92.0 92.6 92.5 91.7  PLT 65* 63* 75* 94* 694*   Basic Metabolic Panel: Recent Labs  Lab 01/11/20 0832 01/12/20 0649 01/13/20 0300 01/14/20 0425  NA 120* 129* 129* 126*  K 3.9 3.4* 3.8 3.7  CL 89* 95* 97* 95*  CO2 24 22 24 25   GLUCOSE 119* 135* 125* 107*  BUN 8 9 10  6*  CREATININE 0.62 0.66 0.67 0.43*  CALCIUM 7.8* 7.6* 7.3* 7.3*  MG  --   --  2.0  --   PHOS  --   --  1.2*  --    GFR: Estimated Creatinine Clearance: 48 mL/min (A) (by C-G formula based on SCr of 0.43 mg/dL (L)). Liver Function Tests: Recent Labs  Lab 01/11/20 0832 01/12/20 0649 01/13/20 0300 01/14/20 0425  AST 908* 751* 472* 345*  ALT 349* 319* 225* 178*  ALKPHOS 489* 644* 519* 379*  BILITOT 0.8 0.9 0.8 0.8  PROT 5.9* 5.6* 5.1* 4.8*  ALBUMIN 3.1* 2.8* 2.5* 2.3*   Recent Labs  Lab 01/11/20 0832  LIPASE 41   No results for input(s): AMMONIA in the last 168 hours. Coagulation Profile: Recent Labs  Lab 01/11/20 1044  INR 1.0   Cardiac Enzymes: Recent Labs  Lab 01/11/20 1253  CKTOTAL 167   BNP (last 3 results) No results for input(s): PROBNP in the last 8760 hours. HbA1C: No results for input(s): HGBA1C in the last 72 hours. CBG: No results for input(s): GLUCAP in the last 168 hours. Lipid Profile: Recent Labs    01/12/20 1011  CHOL 120  HDL 36*  LDLCALC 55  TRIG 143  CHOLHDL 3.3   Thyroid Function Tests: Recent Labs    01/12/20 1011  TSH 0.370    Anemia Panel: No results for input(s): VITAMINB12, FOLATE, FERRITIN, TIBC, IRON, RETICCTPCT in the last 72 hours. Urine analysis:    Component Value Date/Time   COLORURINE STRAW (A) 01/11/2020 1055   APPEARANCEUR CLEAR 01/11/2020 1055   LABSPEC 1.002 (L) 01/11/2020 1055   PHURINE 7.0 01/11/2020 [place] 01/11/2020 1055   HGBUR NEGATIVE 01/11/2020 1055   BILIRUBINUR NEGATIVE 01/11/2020 [place] 01/11/2020 1055   PROTEINUR NEGATIVE 01/11/2020 1055   NITRITE NEGATIVE 01/11/2020 1055  LEUKOCYTESUR NEGATIVE 01/11/2020 1055   Sepsis Labs: @LABRCNTIP (procalcitonin:4,lacticacidven:4)  ) Recent Results (from the past 240 hour(s))  SARS Coronavirus 2 by RT PCR (hospital order, performed in [facility] hospital lab) Nasopharyngeal Nasopharyngeal Swab     Status: None   Collection Time: 01/11/20 10:21 AM   Specimen: Nasopharyngeal Swab  Result Value Ref Range Status   SARS Coronavirus 2 NEGATIVE NEGATIVE Final    Comment: (NOTE) SARS-CoV-2 target nucleic acids are NOT DETECTED. The SARS-CoV-2 RNA is generally detectable in upper and lower respiratory specimens during the acute phase of infection. The lowest concentration of SARS-CoV-2 viral copies this assay can detect is 250 copies / mL. A negative result does not preclude SARS-CoV-2 infection and should not be used as the sole basis for treatment or other patient management decisions.  A negative result may occur with improper specimen collection / handling, submission of specimen other than nasopharyngeal swab, presence of viral mutation(s) within the areas targeted by this assay, and inadequate number of viral copies (<250 copies / mL). A negative result must be combined with clinical observations, patient history, and epidemiological information. Fact Sheet for Patients:   StrictlyIdeas.no Fact Sheet for Healthcare  Providers: BankingDealers.co.za This test is not yet approved or cleared  by the [place] FDA and has been authorized for detection and/or diagnosis of SARS-CoV-2 by FDA under an Emergency Use Authorization (EUA).  This EUA will remain in effect (meaning this test can be used) for the duration of the COVID-19 declaration under Section 564(b)(1) of the Act, 21 U.S.C. section 360bbb-3(b)(1), unless the authorization is terminated or revoked sooner. Performed at [facility], [place] [address], [place], [place] [address]   Respiratory Panel by PCR     Status: None   Collection Time: 01/11/20 10:21 AM   Specimen: Nasopharyngeal Swab; Respiratory  Result Value Ref Range Status   Adenovirus NOT DETECTED NOT DETECTED Final   Coronavirus 229E NOT DETECTED NOT DETECTED Final    Comment: (NOTE) The Coronavirus on the Respiratory Panel, DOES NOT test for the novel  Coronavirus (2019 nCoV)    Coronavirus HKU1 NOT DETECTED NOT DETECTED Final   Coronavirus NL63 NOT DETECTED NOT DETECTED Final   Coronavirus OC43 NOT DETECTED NOT DETECTED Final   Metapneumovirus NOT DETECTED NOT DETECTED Final   Rhinovirus / Enterovirus NOT DETECTED NOT DETECTED Final   Influenza A NOT DETECTED NOT DETECTED Final   Influenza B NOT DETECTED NOT DETECTED Final   Parainfluenza Virus 1 NOT DETECTED NOT DETECTED Final   Parainfluenza Virus 2 NOT DETECTED NOT DETECTED Final   Parainfluenza Virus 3 NOT DETECTED NOT DETECTED Final   Parainfluenza Virus 4 NOT DETECTED NOT DETECTED Final   Respiratory Syncytial Virus NOT DETECTED NOT DETECTED Final   Bordetella pertussis NOT DETECTED NOT DETECTED Final   Chlamydophila pneumoniae NOT DETECTED NOT DETECTED Final   Mycoplasma pneumoniae NOT DETECTED NOT DETECTED Final    Comment: Performed at [facility], [place]. [address] [place], [place] [address]  Resp Panel by RT PCR (RSV, Flu A&B, Covid) - Nasopharyngeal Swab     Status: None    Collection Time: 01/11/20  4:34 PM   Specimen: Nasopharyngeal Swab  Result Value Ref Range Status   SARS Coronavirus 2 by RT PCR NEGATIVE NEGATIVE Final    Comment: (NOTE) SARS-CoV-2 target nucleic acids are NOT DETECTED. The SARS-CoV-2 RNA is generally detectable in upper respiratoy specimens during the acute phase of infection. The lowest concentration of SARS-CoV-2 viral  copies this assay can detect is 131 copies/mL. A negative result does not preclude SARS-Cov-2 infection and should not be used as the sole basis for treatment or other patient management decisions. A negative result may occur with  improper specimen collection/handling, submission of specimen other than nasopharyngeal swab, presence of viral mutation(s) within the areas targeted by this assay, and inadequate number of viral copies (<131 copies/mL). A negative result must be combined with clinical observations, patient history, and epidemiological information. The expected result is Negative. Fact Sheet for Patients:  PinkCheek.be Fact Sheet for Healthcare Providers:  GravelBags.it This test is not yet ap proved or cleared by the [place] FDA and  has been authorized for detection and/or diagnosis of SARS-CoV-2 by FDA under an Emergency Use Authorization (EUA). This EUA will remain  in effect (meaning this test can be used) for the duration of the COVID-19 declaration under Section 564(b)(1) of the Act, 21 U.S.C. section 360bbb-3(b)(1), unless the authorization is terminated or revoked sooner.    Influenza A by PCR NEGATIVE NEGATIVE Final   Influenza B by PCR NEGATIVE NEGATIVE Final    Comment: (NOTE) The Xpert Xpress SARS-CoV-2/FLU/RSV assay is intended as an aid in  the diagnosis of influenza from Nasopharyngeal swab specimens and  should not be used as a sole basis for treatment. Nasal washings and  aspirates are unacceptable for Xpert Xpress  SARS-CoV-2/FLU/RSV  testing. Fact Sheet for Patients: PinkCheek.be Fact Sheet for Healthcare Providers: GravelBags.it This test is not yet approved or cleared by the [place] FDA and  has been authorized for detection and/or diagnosis of SARS-CoV-2 by  FDA under an Emergency Use Authorization (EUA). This EUA will remain  in effect (meaning this test can be used) for the duration of the  Covid-19 declaration under Section 564(b)(1) of the Act, 21  U.S.C. section 360bbb-3(b)(1), unless the authorization is  terminated or revoked.    Respiratory Syncytial Virus by PCR NEGATIVE NEGATIVE Final    Comment: (NOTE) Fact Sheet for Patients: PinkCheek.be Fact Sheet for Healthcare Providers: GravelBags.it This test is not yet approved or cleared by the [place] FDA and  has been authorized for detection and/or diagnosis of SARS-CoV-2 by  FDA under an Emergency Use Authorization (EUA). This EUA will remain  in effect (meaning this test can be used) for the duration of the  COVID-19 declaration under Section 564(b)(1) of the Act, 21 U.S.C.  section 360bbb-3(b)(1), unless the authorization is terminated or  revoked. Performed at [facility], [place] [address], [place], [place] [address]   MRSA PCR Screening     Status: None   Collection Time: 01/12/20 10:42 AM   Specimen: Nasal Mucosa; Nasopharyngeal  Result Value Ref Range Status   MRSA by PCR NEGATIVE NEGATIVE Final    Comment:        The GeneXpert MRSA Assay (FDA approved for NASAL specimens only), is one component of a comprehensive MRSA colonization surveillance program. It is not intended to diagnose MRSA infection nor to guide or monitor treatment for MRSA infections. Performed at [facility], [place] [address], [place], [place] [address]          Radiology Studies: DG CHEST PORT 1  VIEW  Result Date: 01/14/2020 CLINICAL DATA:  Acute respiratory failure.  Hypoxia. EXAM: PORTABLE CHEST 1 VIEW COMPARISON:  Single-view of the chest 01/13/2020 and 01/12/2020. PA and lateral chest 01/11/2020. CT chest 01/11/2020. FINDINGS: Extensive bilateral airspace disease persists. Aeration in the right upper and  lower lung zones has improved but aeration in the left lung base has worsened. Small bilateral pleural effusions are seen. No pneumothorax. Heart size is normal. IMPRESSION: Multifocal pneumonia. There has been some improved aeration in the right chest but airspace disease in the left lung base is worse than on yesterday's exam. Electronically Signed   By: Inge Rise M.D.   On: 01/14/2020 09:25   DG Chest Port 1 View  Result Date: 01/13/2020 CLINICAL DATA:  Respiratory distress EXAM: PORTABLE CHEST 1 VIEW COMPARISON:  January 12, 2020 FINDINGS: There is progression of consolidation in the right upper lobe compared to 1 day prior. There is stable airspace opacity throughout much of the left upper lobe as well as in the right base. There are pleural effusions bilaterally, similar to 1 day prior. Heart size normal. No adenopathy evident. Pulmonary vascularity within normal limits. No bone lesions. IMPRESSION: Multifocal airspace opacity with increase in consolidation in the right upper lobe. Other areas of airspace opacity appear stable compared to 1 day prior. Small pleural effusions bilaterally. Stable cardiac silhouette. Electronically Signed   By: Lowella Grip III M.D.   On: 01/13/2020 09:28        Scheduled Meds: . benzonatate  200 mg Oral TID  . budesonide (PULMICORT) nebulizer solution  0.5 mg Nebulization BID  . calcium carbonate  1 tablet Oral Q breakfast  . multivitamin with minerals  1 tablet Oral Daily   Continuous Infusions: . cefTRIAXone (ROCEPHIN)  IV 1 g (01/14/20 0904)  . doxycycline (VIBRAMYCIN) IV 100 mg (01/14/20 0905)     LOS: 3 days    Time spent: 25  minutes    Edwin Dada, MD [facility] 01/14/2020, 4:41 PM     Please page though [place] or Epic secure chat:  For [employer], Adult nurse

## 2020-01-14 NOTE — Progress Notes (Signed)
[facility] for Infectious Disease   Reason for visit: Follow up on pneumonia  Interval History: oxygen down to 4L [place].  Husband at bedside.  WBC wnl, platelets up to 125, LFTs trending down.     Physical Exam: Constitutional:  Vitals:   01/14/20 0725 01/14/20 0727  BP:  95/63  Pulse:  80  Resp:  (!) 21  Temp:  98.8 F (37.1 C)  SpO2: 92% 92%   patient appears in NAD Respiratory: increased respiratory rate; speaking in full sentences.  Cardiovascular: tachy RR GI: soft, nt, nd  Review of Systems: Constitutional: negative for fevers and chills Respiratory: positive for cough or dyspnea on exertion Gastrointestinal: negative for nausea and diarrhea  Lab Results  Component Value Date   WBC 5.5 01/14/2020   HGB 12.0 01/14/2020   HCT 35.4 (L) 01/14/2020   MCV 91.7 01/14/2020   PLT 125 (L) 01/14/2020    Lab Results  Component Value Date   CREATININE 0.43 (L) 01/14/2020   BUN 6 (L) 01/14/2020   NA 126 (L) 01/14/2020   K 3.7 01/14/2020   CL 95 (L) 01/14/2020   CO2 25 01/14/2020    Lab Results  Component Value Date   ALT 178 (H) 01/14/2020   AST 345 (H) 01/14/2020   ALKPHOS 379 (H) 01/14/2020     Microbiology: Recent Results (from the past 240 hour(s))  SARS Coronavirus 2 by RT PCR (hospital order, performed in [facility] hospital lab) Nasopharyngeal Nasopharyngeal Swab     Status: None   Collection Time: 01/11/20 10:21 AM   Specimen: Nasopharyngeal Swab  Result Value Ref Range Status   SARS Coronavirus 2 NEGATIVE NEGATIVE Final    Comment: (NOTE) SARS-CoV-2 target nucleic acids are NOT DETECTED. The SARS-CoV-2 RNA is generally detectable in upper and lower respiratory specimens during the acute phase of infection. The lowest concentration of SARS-CoV-2 viral copies this assay can detect is 250 copies / mL. A negative result does not preclude SARS-CoV-2 infection and should not be used as the sole basis for treatment or other patient management  decisions.  A negative result may occur with improper specimen collection / handling, submission of specimen other than nasopharyngeal swab, presence of viral mutation(s) within the areas targeted by this assay, and inadequate number of viral copies (<250 copies / mL). A negative result must be combined with clinical observations, patient history, and epidemiological information. Fact Sheet for Patients:   StrictlyIdeas.no Fact Sheet for Healthcare Providers: BankingDealers.co.za This test is not yet approved or cleared  by the [place] FDA and has been authorized for detection and/or diagnosis of SARS-CoV-2 by FDA under an Emergency Use Authorization (EUA).  This EUA will remain in effect (meaning this test can be used) for the duration of the COVID-19 declaration under Section 564(b)(1) of the Act, 21 U.S.C. section 360bbb-3(b)(1), unless the authorization is terminated or revoked sooner. Performed at [facility], [place] [address], [place], [place] [address]   Respiratory Panel by PCR     Status: None   Collection Time: 01/11/20 10:21 AM   Specimen: Nasopharyngeal Swab; Respiratory  Result Value Ref Range Status   Adenovirus NOT DETECTED NOT DETECTED Final   Coronavirus 229E NOT DETECTED NOT DETECTED Final    Comment: (NOTE) The Coronavirus on the Respiratory Panel, DOES NOT test for the novel  Coronavirus (2019 nCoV)    Coronavirus HKU1 NOT DETECTED NOT DETECTED Final   Coronavirus NL63 NOT DETECTED NOT DETECTED Final  Coronavirus OC43 NOT DETECTED NOT DETECTED Final   Metapneumovirus NOT DETECTED NOT DETECTED Final   Rhinovirus / Enterovirus NOT DETECTED NOT DETECTED Final   Influenza A NOT DETECTED NOT DETECTED Final   Influenza B NOT DETECTED NOT DETECTED Final   Parainfluenza Virus 1 NOT DETECTED NOT DETECTED Final   Parainfluenza Virus 2 NOT DETECTED NOT DETECTED Final   Parainfluenza Virus 3 NOT DETECTED NOT  DETECTED Final   Parainfluenza Virus 4 NOT DETECTED NOT DETECTED Final   Respiratory Syncytial Virus NOT DETECTED NOT DETECTED Final   Bordetella pertussis NOT DETECTED NOT DETECTED Final   Chlamydophila pneumoniae NOT DETECTED NOT DETECTED Final   Mycoplasma pneumoniae NOT DETECTED NOT DETECTED Final    Comment: Performed at [facility], [place] [address] [place], [place] [address]  Resp Panel by RT PCR (RSV, Flu A&B, Covid) - Nasopharyngeal Swab     Status: None   Collection Time: 01/11/20  4:34 PM   Specimen: Nasopharyngeal Swab  Result Value Ref Range Status   SARS Coronavirus 2 by RT PCR NEGATIVE NEGATIVE Final    Comment: (NOTE) SARS-CoV-2 target nucleic acids are NOT DETECTED. The SARS-CoV-2 RNA is generally detectable in upper respiratoy specimens during the acute phase of infection. The lowest concentration of SARS-CoV-2 viral copies this assay can detect is 131 copies/mL. A negative result does not preclude SARS-Cov-2 infection and should not be used as the sole basis for treatment or other patient management decisions. A negative result may occur with  improper specimen collection/handling, submission of specimen other than nasopharyngeal swab, presence of viral mutation(s) within the areas targeted by this assay, and inadequate number of viral copies (<131 copies/mL). A negative result must be combined with clinical observations, patient history, and epidemiological information. The expected result is Negative. Fact Sheet for Patients:  PinkCheek.be Fact Sheet for Healthcare Providers:  GravelBags.it This test is not yet ap proved or cleared by the [place] FDA and  has been authorized for detection and/or diagnosis of SARS-CoV-2 by FDA under an Emergency Use Authorization (EUA). This EUA will remain  in effect (meaning this test can be used) for the duration of the COVID-19 declaration under Section  564(b)(1) of the Act, 21 U.S.C. section 360bbb-3(b)(1), unless the authorization is terminated or revoked sooner.    Influenza A by PCR NEGATIVE NEGATIVE Final   Influenza B by PCR NEGATIVE NEGATIVE Final    Comment: (NOTE) The Xpert Xpress SARS-CoV-2/FLU/RSV assay is intended as an aid in  the diagnosis of influenza from Nasopharyngeal swab specimens and  should not be used as a sole basis for treatment. Nasal washings and  aspirates are unacceptable for Xpert Xpress SARS-CoV-2/FLU/RSV  testing. Fact Sheet for Patients: PinkCheek.be Fact Sheet for Healthcare Providers: GravelBags.it This test is not yet approved or cleared by the [place] FDA and  has been authorized for detection and/or diagnosis of SARS-CoV-2 by  FDA under an Emergency Use Authorization (EUA). This EUA will remain  in effect (meaning this test can be used) for the duration of the  Covid-19 declaration under Section 564(b)(1) of the Act, 21  U.S.C. section 360bbb-3(b)(1), unless the authorization is  terminated or revoked.    Respiratory Syncytial Virus by PCR NEGATIVE NEGATIVE Final    Comment: (NOTE) Fact Sheet for Patients: PinkCheek.be Fact Sheet for Healthcare Providers: GravelBags.it This test is not yet approved or cleared by the [place] FDA and  has been authorized for detection and/or diagnosis of SARS-CoV-2 by  FDA  under an Emergency Use Authorization (EUA). This EUA will remain  in effect (meaning this test can be used) for the duration of the  COVID-19 declaration under Section 564(b)(1) of the Act, 21 U.S.C.  section 360bbb-3(b)(1), unless the authorization is terminated or  revoked. Performed at [facility], [place] [address] [place], [place] [address]   MRSA PCR Screening     Status: None   Collection Time: 01/12/20 10:42 AM   Specimen: Nasal Mucosa;  Nasopharyngeal  Result Value Ref Range Status   MRSA by PCR NEGATIVE NEGATIVE Final    Comment:        The GeneXpert MRSA Assay (FDA approved for NASAL specimens only), is one component of a comprehensive MRSA colonization surveillance program. It is not intended to diagnose MRSA infection nor to guide or monitor treatment for MRSA infections. Performed at [facility], [place] [address], [place], [place] [address]     Impression/Plan:  1. Acute hypoxic respiratory failure - bilateral infiltrates and effusions.  Some improvement with her oxygenation.  Tests to date are negative.   Continue with current antibiotics.  Differential narrowed by viral, bacterial, atypical organisms possible.   2.   Transaminitis - continues to improve.  Certainly viral process possible.   Cmv, ebv added.    3.  Thrombocytopenia - improving.  Will continue to monitor.

## 2020-01-14 NOTE — Progress Notes (Signed)
Name: Kristina Myers MRN: [id number] DOB: 1945-10-16    ADMISSION DATE:  01/11/2020 CONSULTATION DATE:  01/14/2020   REFERRING MD :  Dr. Roosevelt Locks, [place] MD  CHIEF COMPLAINT: Cough and dyspnea  BRIEF PATIENT DESCRIPTION: 74 year old F, never smoker, admitted with 2 days of dry cough and shortness of breath.  She recently received Shingrix vaccine approximately 1 week prior to admit.  After the vaccine, she had headaches and chills for 2 days, then developed vomiting and dry cough.  Her last COVID vaccine was 3/12. She had a recent trip to [facility] and found three ticks on her.   Initial evaluation found her to have room air saturations of 88% and CXR with patchy bilateral infiltrates. Labs notable for WBC 2.1, thrombocytopenia and transaminitis.  She was admitted for concern of atypical pneumonia, hypoxia, elevated LFTs and hyponatremia.  COVID testing negative.   PAST MEDICAL HISTORY:  Arthritis  C-Section with Tubal Ligation  SIGNIFICANT EVENTS  6/07 Admit   STUDIES:   CT angiogram chest 6/7  >> Upper lobe predominant smooth interlobular septal thickening with patchy and confluent peribronchovascular ground-glass densities, concerning for pulmonary edema, less likely atypical infection. Small to moderate bilateral pleural effusions.  Small perihepatic ascites.  Ultrasound liver 6/7 >> Gallbladder sludge with asymmetric wall thickening and edema.  No CBD dilatation  TTE 6/7 >> LVEF 60-65%, no RWMA, grade 1 diastolic dysfunction, RV normal size  MICRO RVP 6/7 >> negative  MRSA PCR 6/8 >> negative  COVID 6/7 >> negative  Influenza A/B 6/7 >> negative  U. Strep Antigen 6/7 >> negative  Legionella 6/7 >> negative    SUBJECTIVE:  O2 weaned to 4L  Tmax 98.8 / WBC 5.5  I/O 965m UOP in last 24 hours   VITAL SIGNS: Temp:  [97.6 F (36.4 C)-98.8 F (37.1 C)] 98.8 F (37.1 C) (06/10 0727) Pulse Rate:  [79-99] 80 (06/10 0727) Resp:  [18-25] 21 (06/10  0727) BP: (84-102)/(51-68) 95/63 (06/10 0727) SpO2:  [82 %-100 %] 92 % (06/10 0727)  PHYSICAL EXAMINATION: General: elderly female lying in bed in NAD, husband at bedside HEENT: MM pink/moist, [place] O2, scant dried bloody secretions in nares Neuro: AAOx4, speech clear, MAE  CV: s1s2 RRR, no m/r/g PULM:  Non-labored on 4L, lungs bilaterally with crackles GI: soft, bsx4 active  Extremities: warm/dry, no edema  Skin: no rashes or lesions  Recent Labs  Lab 01/12/20 0649 01/13/20 0300 01/14/20 0425  NA 129* 129* 126*  K 3.4* 3.8 3.7  CL 95* 97* 95*  CO2 '22 24 25  ' BUN 9 10 6*  CREATININE 0.66 0.67 0.43*  GLUCOSE 135* 125* 107*   Recent Labs  Lab 01/12/20 0649 01/13/20 0300 01/14/20 0425  HGB 13.7 12.7 12.0  HCT 40.3 37.2 35.4*  WBC 2.9* 4.7 5.5  PLT 75* 94* 125*   DG CHEST PORT 1 VIEW  Result Date: 01/14/2020 CLINICAL DATA:  Acute respiratory failure.  Hypoxia. EXAM: PORTABLE CHEST 1 VIEW COMPARISON:  Single-view of the chest 01/13/2020 and 01/12/2020. PA and lateral chest 01/11/2020. CT chest 01/11/2020. FINDINGS: Extensive bilateral airspace disease persists. Aeration in the right upper and lower lung zones has improved but aeration in the left lung base has worsened. Small bilateral pleural effusions are seen. No pneumothorax. Heart size is normal. IMPRESSION: Multifocal pneumonia. There has been some improved aeration in the right chest but airspace disease in the left lung base is worse than on yesterday's exam. Electronically Signed   By: TMarcello Moores  Dalessio M.D.   On: 01/14/2020 09:25   DG Chest Port 1 View  Result Date: 01/13/2020 CLINICAL DATA:  Respiratory distress EXAM: PORTABLE CHEST 1 VIEW COMPARISON:  January 12, 2020 FINDINGS: There is progression of consolidation in the right upper lobe compared to 1 day prior. There is stable airspace opacity throughout much of the left upper lobe as well as in the right base. There are pleural effusions bilaterally, similar to 1 day prior.  Heart size normal. No adenopathy evident. Pulmonary vascularity within normal limits. No bone lesions. IMPRESSION: Multifocal airspace opacity with increase in consolidation in the right upper lobe. Other areas of airspace opacity appear stable compared to 1 day prior. Small pleural effusions bilaterally. Stable cardiac silhouette. Electronically Signed   By: Lowella Grip III M.D.   On: 01/13/2020 09:28    ASSESSMENT / PLAN:  Acute Hypoxemic Respiratory Failure in setting of Bilateral Infiltrates Small to Moderate Bilateral Pleural Effusions CTA negative for PE but shows upper lobe predominant smooth interlobular septal thickening, with patchy peribronchovascular ground-glass densities.  DDx includes atypical PNA, aspiration with prior vomiting, rickettsial / tick borne disease given tick exposures, non-cardiogenic pulmonary edema and pneumonitis after shingles vaccline. COVID, influenza, RVP, Legionella / Strep antigen negative. LVEF normal / grade I diastolic dysfunction.  Sister with history of RA.   -follow intermittent CXR -wean O2 for sats >90% -follow up infectious disease markers  -continue pulmicort while inpatient  -pulmonary hygiene - IS, mobilize  -outpatient pulmonary follow up arranged, will need f/u CXR to ensure resolution of infiltrates -send ESR, RF, ANA  Sepsis  Leukopenia Recent Tick Exposure  -abx per ID  -appreciate ID assistance with patient care   Hyponatremia  Hypophosphatemia  Serum Osm 263, Urine Osm 83, Urine Na <10.  -follow electrolytes, replace as indicated   Elevated AST/ALT -monitor LFT trend  -avoid hepatotoxic agents   Elevated Troponin / Demand Ischemia  -appreciate Cardiology input -no invasive work up       Noe Gens, MSN, NP-C [facility] 01/14/2020, 9:56 AM   Please see [email or web address] for pager details.

## 2020-01-15 ENCOUNTER — Inpatient Hospital Stay (HOSPITAL_COMMUNITY): Payer: Medicare Other

## 2020-01-15 LAB — EPSTEIN-BARR VIRUS VCA, IGM: EBV VCA IgM: <36 U/mL (ref 0.0–35.9)

## 2020-01-15 LAB — COMPREHENSIVE METABOLIC PANEL
ALT: 187 U/L — ABNORMAL HIGH (ref 0–44)
AST: 295 U/L — ABNORMAL HIGH (ref 15–41)
Albumin: 2.6 g/dL — ABNORMAL LOW (ref 3.5–5.0)
Alkaline Phosphatase: 382 U/L — ABNORMAL HIGH (ref 38–126)
Anion gap: 7 (ref 5–15)
BUN: <5 mg/dL — ABNORMAL LOW (ref 8–23)
CO2: 26 mmol/L (ref 22–32)
Calcium: 7.8 mg/dL — ABNORMAL LOW (ref 8.9–10.3)
Chloride: 99 mmol/L (ref 98–111)
Creatinine, Ser: 0.4 mg/dL — ABNORMAL LOW (ref 0.44–1.00)
GFR calc Af Amer: >60 mL/min (ref >60)
GFR calc non Af Amer: >60 mL/min (ref >60)
Glucose, Bld: 159 mg/dL — ABNORMAL HIGH (ref 70–99)
Potassium: 4 mmol/L (ref 3.5–5.1)
Sodium: 132 mmol/L — ABNORMAL LOW (ref 135–145)
Total Bilirubin: 0.8 mg/dL (ref 0.3–1.2)
Total Protein: 5.8 g/dL — ABNORMAL LOW (ref 6.5–8.1)

## 2020-01-15 LAB — CBC
HCT: 37.9 % (ref 36.0–46.0)
Hemoglobin: 13.2 g/dL (ref 12.0–15.0)
MCH: 31.6 pg (ref 26.0–34.0)
MCHC: 34.8 g/dL (ref 30.0–36.0)
MCV: 90.7 fL (ref 80.0–100.0)
Platelets: 145 10*3/uL — ABNORMAL LOW (ref 150–400)
RBC: 4.18 MIL/uL (ref 3.87–5.11)
RDW: 12.3 % (ref 11.5–15.5)
WBC: 4.8 10*3/uL (ref 4.0–10.5)
nRBC: 0 % (ref 0.0–0.2)

## 2020-01-15 LAB — ANTINUCLEAR ANTIBODIES, IFA: ANA Ab, IFA: NEGATIVE

## 2020-01-15 LAB — RHEUMATOID FACTOR: Rheumatoid fact SerPl-aCnc: <10 IU/mL (ref 0.0–13.9)

## 2020-01-15 LAB — CMV IGM: CMV IgM: <30 AU/mL (ref 0.0–29.9)

## 2020-01-15 MED ORDER — DOXYCYCLINE HYCLATE 100 MG PO TABS
100.0000 mg | ORAL_TABLET | Freq: Two times a day (BID) | ORAL | Status: DC
  Administered 2020-01-15 – 2020-01-18 (×6): 100 mg via ORAL
  Filled 2020-01-15 (×6): qty 1

## 2020-01-15 NOTE — Progress Notes (Signed)
Progress Note  Patient Name: Kristina Myers Date of Encounter: 01/15/2020  Primary Cardiologist: No primary care provider on file.   Subjective   Still SOB but improved.  O2 requirements decreasing and now on 2L with O2 sats 92%  Inpatient Medications    Scheduled Meds: . benzonatate  200 mg Oral TID  . budesonide (PULMICORT) nebulizer solution  0.5 mg Nebulization BID  . calcium carbonate  1 tablet Oral Q breakfast  . multivitamin with minerals  1 tablet Oral Daily  . predniSONE  40 mg Oral Daily   Continuous Infusions: . cefTRIAXone (ROCEPHIN)  IV 1 g (01/14/20 0904)  . doxycycline (VIBRAMYCIN) IV 100 mg (01/14/20 2133)   PRN Meds: ibuprofen, ipratropium-albuterol, ondansetron **OR** ondansetron (ZOFRAN) IV, oxyCODONE, polyvinyl alcohol   Vital Signs    Vitals:   01/14/20 2231 01/15/20 0351 01/15/20 0725 01/15/20 0732  BP: 125/84   123/89  Pulse: 89   96  Resp: (!) 22   (!) 24  Temp: 97.7 F (36.5 C) (!) 97.5 F (36.4 C)  98.4 F (36.9 C)  TempSrc: Oral Oral  Oral  SpO2: 97%  92% 96%  Weight:      Height:        Intake/Output Summary (Last 24 hours) at 01/15/2020 0742 Last data filed at 01/15/2020 8546 Gross per 24 hour  Intake 1660 ml  Output 1700 ml  Net -40 ml   Filed Weights   01/11/20 2703  Weight: 48.5 kg    Telemetry    NSR- Personally Reviewed  ECG  No new EKG to review- Personally Reviewed  Physical Exam   GEN: Well nourished, well developed in no acute distress HEENT: Normal NECK: No JVD; No carotid bruits LYMPHATICS: No lymphadenopathy CARDIAC:RRR, no murmurs, rubs, gallops RESPIRATORY:  Crackles throughout ABDOMEN: Soft, non-tender, non-distended MUSCULOSKELETAL:  No edema; No deformity  SKIN: Warm and dry NEUROLOGIC:  Alert and oriented x 3 PSYCHIATRIC:  Normal affect    Labs    Chemistry Recent Labs  Lab 01/12/20 0649 01/13/20 0300 01/14/20 0425  NA 129* 129* 126*  K 3.4* 3.8 3.7  CL 95* 97* 95*  CO2  22 24 25   GLUCOSE 135* 125* 107*  BUN 9 10 6*  CREATININE 0.66 0.67 0.43*  CALCIUM 7.6* 7.3* 7.3*  PROT 5.6* 5.1* 4.8*  ALBUMIN 2.8* 2.5* 2.3*  AST 751* 472* 345*  ALT 319* 225* 178*  ALKPHOS 644* 519* 379*  BILITOT 0.9 0.8 0.8  GFRNONAA >60 >60 >60  GFRAA >60 >60 >60  ANIONGAP 12 8 6      Hematology Recent Labs  Lab 01/13/20 0300 01/14/20 0425 01/15/20 0507  WBC 4.7 5.5 4.8  RBC 4.02 3.86* 4.18  HGB 12.7 12.0 13.2  HCT 37.2 35.4* 37.9  MCV 92.5 91.7 90.7  MCH 31.6 31.1 31.6  MCHC 34.1 33.9 34.8  RDW 12.4 12.6 12.3  PLT 94* 125* 145*    Cardiac EnzymesNo results for input(s): TROPONINI in the last 168 hours. No results for input(s): TROPIPOC in the last 168 hours.   BNP Recent Labs  Lab 01/11/20 1312  BNP 66.5     DDimer  Recent Labs  Lab 01/11/20 1044  DDIMER 7.03*     Radiology    DG CHEST PORT 1 VIEW  Result Date: 01/14/2020 CLINICAL DATA:  Acute respiratory failure.  Hypoxia. EXAM: PORTABLE CHEST 1 VIEW COMPARISON:  Single-view of the chest 01/13/2020 and 01/12/2020. PA and lateral chest 01/11/2020. CT chest 01/11/2020. FINDINGS: Extensive  bilateral airspace disease persists. Aeration in the right upper and lower lung zones has improved but aeration in the left lung base has worsened. Small bilateral pleural effusions are seen. No pneumothorax. Heart size is normal. IMPRESSION: Multifocal pneumonia. There has been some improved aeration in the right chest but airspace disease in the left lung base is worse than on yesterday's exam. Electronically Signed   By: Inge Rise M.D.   On: 01/14/2020 09:25    Cardiac Studies   2D echo 01/09/2020 IMPRESSIONS    1. Left ventricular ejection fraction, by estimation, is 60 to 65%. The  left ventricle has normal function. The left ventricle has no regional  wall motion abnormalities. Left ventricular diastolic parameters are  consistent with Grade I diastolic  dysfunction (impaired relaxation).  2. Right  ventricular systolic function is normal. The right ventricular  size is normal.  3. The mitral valve is grossly normal. Trivial mitral valve  regurgitation.  4. The aortic valve is tricuspid. Aortic valve regurgitation is not  visualized.  5. The inferior vena cava is dilated in size with >50% respiratory  variability, suggesting right atrial pressure of 8 mmHg.   Patient Profile     74 y.o. female with no significant PMH who is being seen today for the evaluation of SOB at the request of Wynetta Fines, MD.  [place]    1. Acute respiratory failure with hypoxia -started after developing what sounds like a respiratory illness with chills, cough, N/V -now with hypoxia>>O2 has been weaned down to 2L with O2 sats 92% -Legionella and RMSF ruled out>>Erlichia PCR positive -BNP is normal  -chest CT neg for PE but does show bilateral pleural effusions, pulmonary edema or infection>>in setting of normal BNp in a thin patient, suspect this is not CHF and is non cardiogenic pulmonary edema -? tick borne illness given labs and other sx and tick exposure in the past week  -pulmonary following  2.  Elevated troponin -hsTrop mildly elevated with fiarly flat trend [phone]) -EKG is nonischemic and she has no CRFs except advanced age.  She has never smoked and has no fm hx of CAD -2D echo with normal LVF -this is consistent with demand ischemia -no further workup for ischemia indicated at this time  3.  Hyponatremia -Na 120 on admit and was up to 129 yesterday but down to 126  -suspect related to underlying pulmonary issue and  tick borne illness -per CCM  4.  Hepatic transaminitis  -likely related to acute Erlichia -LFTs slightly improved  -continue to follow  5.  Leukopenic/Thrombocytopenic -? Related to bone marrow suppression from acute infection -WBC up to 4.8 today and plt ct slightly improved at 145K -secondary to [place] -antibx coverage broadened with  Doxycycline -appreciate ID input  6.  SVT -had more atrial tach with RVR on tele -suspect driven by underlying respiratory illness -back in NSR currently -BP improved so will add Lopressor 12.5mg  BID  For questions or updates, please contact [facility] Please consult [email or web address] for contact info under Cardiology/STEMI.      Signed, Fransico Him, MD  01/15/2020, 7:42 AM

## 2020-01-15 NOTE — Progress Notes (Signed)
PROGRESS NOTE    Kristina Myers  VEH:[id number] DOB: Feb 16, 1946 DOA: 01/11/2020 PCP: Kelton Pillar, MD      Brief Narrative:  Mrs. Kristina Myers is a 74 y.o. F with no significant PMHx who presented with dry cough, chills, headache.  Her symptoms progressively worsened until she was short of breath so she came to the ER.  In the ER, chest x-ray showed bilateral infiltrates, WBC 2.1, platelets 65, AST 908 and sodium 120.        Assessment & Plan:  Ehrlichiosis Patient recently spent a few weeks at her vacation home at [facility] in [place].  During that time she was gardening, found several ticks on her.  Subsequently developed fever, malaise, respiratory distress.  Presented with leukopenia, hyponatremia, transaminitis, and thrombocytopenia. 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   -Continue doxycycline, day 4 of 7 -Consult ID, appreciate expert guidance    Acute respiratory failure with hypoxia This appears to be pulmonary involvement of HME.     Required 15L initially, now weaned down and respirations more comfortable.   -Continue Tessalon, bronchodilators, Pulmicort per Pulmonology -Prednisone started by Pulm -Consult Pulmonlogy, appreciate cares -Stop ceftriaxone  Elevated troponin Demand ishcemia, no further ischemic work up necessary, ACS ruled out.  Peaked at 12. -Consult cardiology, appreciate recommendations  Hyponatremia Urine appropriately dilute.  Na better today. Asymptomatic.  SVT Brief, nonsustained.  Agree with Dr. Radford Pax, likely acutely related to hypoxia. -Cardiology plan for watchful waiting, outpatient Zio and consideration of BB as outpatient  Liver cyst -Repeat US in 6 months          Disposition: Status is: Inpatient  Remains inpatient appropriate because:She remains severely out of breath, fatigued unable to walk more than 2 feet, and with hypoxia requiring supplemental oxygen   Dispo:  Patient  From: Home  Planned Disposition: [place]  Expected discharge date: 01/18/20  Medically stable for discharge: No                 MDM: The below labs and imaging reports reviewed and summarized above.  Medication management as above.  This is a severe illness with threat to life or bodily function   DVT prophylaxis: SCDs Code Status: FULL Family Communication: Husband   Consultants:   Pulm  Cards  ID  Procedures:   Echo  Antimicrobials:   Doxycycline 6/8 >>  Ceftriaxone 6/7 >>  6/11  Culture data:              Subjective: She is still incredibly tired, out of breath.  She has rib pain from coughing.  No further fever, confusion. No hemoptysis, chest pain.   Objective: Vitals:   01/14/20 2231 01/15/20 0351 01/15/20 0725 01/15/20 0732  BP: 125/84   123/89  Pulse: 89   96  Resp: (!) 22   (!) 24  Temp: 97.7 F (36.5 C) (!) 97.5 F (36.4 C)  98.4 F (36.9 C)  TempSrc: Oral Oral  Oral  SpO2: 97%  92% 96%  Weight:      Height:        Intake/Output Summary (Last 24 hours) at 01/15/2020 0915 Last data filed at 01/15/2020 0620 Gross per 24 hour  Intake 1660 ml  Output 1700 ml  Net -40 ml   Filed Weights   01/11/20 0822  Weight: 48.5 kg    Examination: General appearance: Thin elderly female, sitting in recliner, appears tired.     HEENT: Anicteric, conjunctival pink, lids and lashes  normal, no nasal deformity, discharge, or epistaxis today. Skin: No rashes or suspicious lesions.  Warm and dry Cardiac: Tachycardic, regular, no murmurs, no lower extremity edema Respiratory: Tachypneic, somewhat labored even at rest, sounds out of breath.  Rales bilaterally, improved from previous. Abdomen:   MSK: Normal muscle bulk and tone for age Neuro: Wake and alert, extraocular movements intact, mild amblyopia on the left, stable from previous days, moves all extremities with severe generalized weakness, speech fluent Psych:  Sensorium intact responding to questions, attention normal, affect blunted, judgment and insight normal     Data Reviewed: I have personally reviewed following labs and imaging studies:  CBC: Recent Labs  Lab 01/11/20 1044 01/12/20 0649 01/13/20 0300 01/14/20 0425 01/15/20 0507  WBC 1.9* 2.9* 4.7 5.5 4.8  NEUTROABS 1.5*  --  3.4  --   --   HGB 12.8 13.7 12.7 12.0 13.2  HCT 36.8 40.3 37.2 35.4* 37.9  MCV 92.0 92.6 92.5 91.7 90.7  PLT 63* 75* 94* 125* 315*   Basic Metabolic Panel: Recent Labs  Lab 01/11/20 0832 01/12/20 0649 01/13/20 0300 01/14/20 0425 01/15/20 0507  NA 120* 129* 129* 126* 132*  K 3.9 3.4* 3.8 3.7 4.0  CL 89* 95* 97* 95* 99  CO2 24 22 24 25 26   GLUCOSE 119* 135* 125* 107* 159*  BUN 8 9 10  6* <5*  CREATININE 0.62 0.66 0.67 0.43* 0.40*  CALCIUM 7.8* 7.6* 7.3* 7.3* 7.8*  MG  --   --  2.0  --   --   PHOS  --   --  1.2*  --   --    GFR: Estimated Creatinine Clearance: 48 mL/min (A) (by C-G formula based on SCr of 0.4 mg/dL (L)). Liver Function Tests: Recent Labs  Lab 01/11/20 0832 01/12/20 0649 01/13/20 0300 01/14/20 0425 01/15/20 0507  AST 908* 751* 472* 345* 295*  ALT 349* 319* 225* 178* 187*  ALKPHOS 489* 644* 519* 379* 382*  BILITOT 0.8 0.9 0.8 0.8 0.8  PROT 5.9* 5.6* 5.1* 4.8* 5.8*  ALBUMIN 3.1* 2.8* 2.5* 2.3* 2.6*   Recent Labs  Lab 01/11/20 0832  LIPASE 41   No results for input(s): AMMONIA in the last 168 hours. Coagulation Profile: Recent Labs  Lab 01/11/20 1044  INR 1.0   Cardiac Enzymes: Recent Labs  Lab 01/11/20 1253  CKTOTAL 167   BNP (last 3 results) No results for input(s): PROBNP in the last 8760 hours. HbA1C: No results for input(s): HGBA1C in the last 72 hours. CBG: No results for input(s): GLUCAP in the last 168 hours. Lipid Profile: Recent Labs    01/12/20 1011  CHOL 120  HDL 36*  LDLCALC 55  TRIG 143  CHOLHDL 3.3   Thyroid Function Tests: Recent Labs    01/12/20 1011  TSH 0.370   Anemia  Panel: No results for input(s): VITAMINB12, FOLATE, FERRITIN, TIBC, IRON, RETICCTPCT in the last 72 hours. Urine analysis:    Component Value Date/Time   COLORURINE STRAW (A) 01/11/2020 1055   APPEARANCEUR CLEAR 01/11/2020 1055   LABSPEC 1.002 (L) 01/11/2020 1055   PHURINE 7.0 01/11/2020 1055   GLUCOSEU NEGATIVE 01/11/2020 1055   HGBUR NEGATIVE 01/11/2020 1055   BILIRUBINUR NEGATIVE 01/11/2020 1055   KETONESUR NEGATIVE 01/11/2020 1055   PROTEINUR NEGATIVE 01/11/2020 1055   NITRITE NEGATIVE 01/11/2020 1055   LEUKOCYTESUR NEGATIVE 01/11/2020 1055   Sepsis Labs: @LABRCNTIP (procalcitonin:4,lacticacidven:4)  ) Recent Results (from the past 240 hour(s))  SARS Coronavirus 2 by RT PCR (hospital order,  performed in [facility] hospital lab) Nasopharyngeal Nasopharyngeal Swab     Status: None   Collection Time: 01/11/20 10:21 AM   Specimen: Nasopharyngeal Swab  Result Value Ref Range Status   SARS Coronavirus 2 NEGATIVE NEGATIVE Final    Comment: (NOTE) SARS-CoV-2 target nucleic acids are NOT DETECTED. The SARS-CoV-2 RNA is generally detectable in upper and lower respiratory specimens during the acute phase of infection. The lowest concentration of SARS-CoV-2 viral copies this assay can detect is 250 copies / mL. A negative result does not preclude SARS-CoV-2 infection and should not be used as the sole basis for treatment or other patient management decisions.  A negative result may occur with improper specimen collection / handling, submission of specimen other than nasopharyngeal swab, presence of viral mutation(s) within the areas targeted by this assay, and inadequate number of viral copies (<250 copies / mL). A negative result must be combined with clinical observations, patient history, and epidemiological information. Fact Sheet for Patients:   StrictlyIdeas.no Fact Sheet for Healthcare Providers: BankingDealers.co.za This test  is not yet approved or cleared  by the [place] FDA and has been authorized for detection and/or diagnosis of SARS-CoV-2 by FDA under an Emergency Use Authorization (EUA).  This EUA will remain in effect (meaning this test can be used) for the duration of the COVID-19 declaration under Section 564(b)(1) of the Act, 21 U.S.C. section 360bbb-3(b)(1), unless the authorization is terminated or revoked sooner. Performed at [facility], [place] [address] [place], [place] [address]   Respiratory Panel by PCR     Status: None   Collection Time: 01/11/20 10:21 AM   Specimen: Nasopharyngeal Swab; Respiratory  Result Value Ref Range Status   Adenovirus NOT DETECTED NOT DETECTED Final   Coronavirus 229E NOT DETECTED NOT DETECTED Final    Comment: (NOTE) The Coronavirus on the Respiratory Panel, DOES NOT test for the novel  Coronavirus (2019 nCoV)    Coronavirus HKU1 NOT DETECTED NOT DETECTED Final   Coronavirus NL63 NOT DETECTED NOT DETECTED Final   Coronavirus OC43 NOT DETECTED NOT DETECTED Final   Metapneumovirus NOT DETECTED NOT DETECTED Final   Rhinovirus / Enterovirus NOT DETECTED NOT DETECTED Final   Influenza A NOT DETECTED NOT DETECTED Final   Influenza B NOT DETECTED NOT DETECTED Final   Parainfluenza Virus 1 NOT DETECTED NOT DETECTED Final   Parainfluenza Virus 2 NOT DETECTED NOT DETECTED Final   Parainfluenza Virus 3 NOT DETECTED NOT DETECTED Final   Parainfluenza Virus 4 NOT DETECTED NOT DETECTED Final   Respiratory Syncytial Virus NOT DETECTED NOT DETECTED Final   Bordetella pertussis NOT DETECTED NOT DETECTED Final   Chlamydophila pneumoniae NOT DETECTED NOT DETECTED Final   Mycoplasma pneumoniae NOT DETECTED NOT DETECTED Final    Comment: Performed at [facility], [place]. [address] [place], [place] [address]  Resp Panel by RT PCR (RSV, Flu A&B, Covid) - Nasopharyngeal Swab     Status: None   Collection Time: 01/11/20  4:34 PM   Specimen: Nasopharyngeal Swab    Result Value Ref Range Status   SARS Coronavirus 2 by RT PCR NEGATIVE NEGATIVE Final    Comment: (NOTE) SARS-CoV-2 target nucleic acids are NOT DETECTED. The SARS-CoV-2 RNA is generally detectable in upper respiratoy specimens during the acute phase of infection. The lowest concentration of SARS-CoV-2 viral copies this assay can detect is 131 copies/mL. A negative result does not preclude SARS-Cov-2 infection and should not be used as the sole basis for  treatment or other patient management decisions. A negative result may occur with  improper specimen collection/handling, submission of specimen other than nasopharyngeal swab, presence of viral mutation(s) within the areas targeted by this assay, and inadequate number of viral copies (<131 copies/mL). A negative result must be combined with clinical observations, patient history, and epidemiological information. The expected result is Negative. Fact Sheet for Patients:  PinkCheek.be Fact Sheet for Healthcare Providers:  GravelBags.it This test is not yet ap proved or cleared by the [place] FDA and  has been authorized for detection and/or diagnosis of SARS-CoV-2 by FDA under an Emergency Use Authorization (EUA). This EUA will remain  in effect (meaning this test can be used) for the duration of the COVID-19 declaration under Section 564(b)(1) of the Act, 21 U.S.C. section 360bbb-3(b)(1), unless the authorization is terminated or revoked sooner.    Influenza A by PCR NEGATIVE NEGATIVE Final   Influenza B by PCR NEGATIVE NEGATIVE Final    Comment: (NOTE) The Xpert Xpress SARS-CoV-2/FLU/RSV assay is intended as an aid in  the diagnosis of influenza from Nasopharyngeal swab specimens and  should not be used as a sole basis for treatment. Nasal washings and  aspirates are unacceptable for Xpert Xpress SARS-CoV-2/FLU/RSV  testing. Fact Sheet for  Patients: PinkCheek.be Fact Sheet for Healthcare Providers: GravelBags.it This test is not yet approved or cleared by the [place] FDA and  has been authorized for detection and/or diagnosis of SARS-CoV-2 by  FDA under an Emergency Use Authorization (EUA). This EUA will remain  in effect (meaning this test can be used) for the duration of the  Covid-19 declaration under Section 564(b)(1) of the Act, 21  U.S.C. section 360bbb-3(b)(1), unless the authorization is  terminated or revoked.    Respiratory Syncytial Virus by PCR NEGATIVE NEGATIVE Final    Comment: (NOTE) Fact Sheet for Patients: PinkCheek.be Fact Sheet for Healthcare Providers: GravelBags.it This test is not yet approved or cleared by the [place] FDA and  has been authorized for detection and/or diagnosis of SARS-CoV-2 by  FDA under an Emergency Use Authorization (EUA). This EUA will remain  in effect (meaning this test can be used) for the duration of the  COVID-19 declaration under Section 564(b)(1) of the Act, 21 U.S.C.  section 360bbb-3(b)(1), unless the authorization is terminated or  revoked. Performed at [facility], [place] [address], [place], [place] [address]   MRSA PCR Screening     Status: None   Collection Time: 01/12/20 10:42 AM   Specimen: Nasal Mucosa; Nasopharyngeal  Result Value Ref Range Status   MRSA by PCR NEGATIVE NEGATIVE Final    Comment:        The GeneXpert MRSA Assay (FDA approved for NASAL specimens only), is one component of a comprehensive MRSA colonization surveillance program. It is not intended to diagnose MRSA infection nor to guide or monitor treatment for MRSA infections. Performed at [facility], [place] [address] [place], [place] [address]          Radiology Studies: DG CHEST PORT 1 VIEW  Result Date: 01/15/2020 CLINICAL DATA:   75 year old female with history of pulmonary infiltrates. EXAM: PORTABLE CHEST 1 VIEW COMPARISON:  Chest x-ray 01/14/2020. FINDINGS: Extensive multifocal airspace consolidation, most evident in the upper lobes of the lungs bilaterally (right greater than left) where there is also widespread air bronchograms. Elevation of the minor fissure indicative of worsening right upper lobe volume loss. Medial basilar opacities bilaterally may reflect additional areas of  atelectasis and/or consolidation. Small bilateral pleural effusions. No evidence of pulmonary edema. Heart size is mildly enlarged. Aortic atherosclerosis. IMPRESSION: 1. Multilobar bilateral pneumonia, with more confluent consolidation and atelectasis in the right upper lobe than seen on the prior study. 2. Small bilateral pleural effusions have decreased slightly. 3. Aortic atherosclerosis. 4. Mild cardiomegaly. Electronically Signed   By: Vinnie Langton M.D.   On: 01/15/2020 08:57   DG CHEST PORT 1 VIEW  Result Date: 01/14/2020 CLINICAL DATA:  Acute respiratory failure.  Hypoxia. EXAM: PORTABLE CHEST 1 VIEW COMPARISON:  Single-view of the chest 01/13/2020 and 01/12/2020. PA and lateral chest 01/11/2020. CT chest 01/11/2020. FINDINGS: Extensive bilateral airspace disease persists. Aeration in the right upper and lower lung zones has improved but aeration in the left lung base has worsened. Small bilateral pleural effusions are seen. No pneumothorax. Heart size is normal. IMPRESSION: Multifocal pneumonia. There has been some improved aeration in the right chest but airspace disease in the left lung base is worse than on yesterday's exam. Electronically Signed   By: Inge Rise M.D.   On: 01/14/2020 09:25        Scheduled Meds: . benzonatate  200 mg Oral TID  . budesonide (PULMICORT) nebulizer solution  0.5 mg Nebulization BID  . calcium carbonate  1 tablet Oral Q breakfast  . multivitamin with minerals  1 tablet Oral Daily  . predniSONE   40 mg Oral Daily   Continuous Infusions: . doxycycline (VIBRAMYCIN) IV 100 mg (01/14/20 2133)     LOS: 4 days    Time spent: 35 minutes    Edwin Dada, MD [facility] 01/15/2020, 9:15 AM     Please page though [place] or Epic secure chat:  For [employer], Adult nurse

## 2020-01-15 NOTE — Progress Notes (Signed)
Name: Kristina Myers MRN: [id number] DOB: 11/03/1945    ADMISSION DATE:  01/11/2020 CONSULTATION DATE:  01/15/2020   REFERRING MD :  Dr. Roosevelt Locks, [facility]  CHIEF COMPLAINT: Cough and dyspnea  BRIEF PATIENT DESCRIPTION: 74 year old F, never smoker, admitted with 2 days of dry cough and shortness of breath.  She recently received Shingrix vaccine approximately 1 week prior to admit.  After the vaccine, she had headaches and chills for 2 days, then developed vomiting and dry cough.  Her last COVID vaccine was 3/12. She had a recent trip to [facility] and found three ticks on her.   Initial evaluation found her to have room air saturations of 88% and CXR with patchy bilateral infiltrates. Labs notable for WBC 2.1, thrombocytopenia and transaminitis.  She was admitted for concern of atypical pneumonia, hypoxia, elevated LFTs and hyponatremia.  COVID testing negative.   PAST MEDICAL HISTORY:  Arthritis  C-Section with Tubal Ligation  SIGNIFICANT EVENTS  6/07 Admit   STUDIES:   CT angiogram chest 6/7  >> Upper lobe predominant smooth interlobular septal thickening with patchy and confluent peribronchovascular ground-glass densities, concerning for pulmonary edema, less likely atypical infection. Small to moderate bilateral pleural effusions.  Small perihepatic ascites.  Ultrasound liver 6/7 >> Gallbladder sludge with asymmetric wall thickening and edema.  No CBD dilatation  TTE 6/7 >> LVEF 60-65%, no RWMA, grade 1 diastolic dysfunction, RV normal size  MICRO RVP 6/7 >> negative  MRSA PCR 6/8 >> negative  COVID 6/7 >> negative  Influenza A/B 6/7 >> negative  U. Strep Antigen 6/7 >> negative  Legionella 6/7 >> negative  Ehrlichia PCR positive   SUBJECTIVE:  O2 weaned to 1L  Tmax 98.8 / WBC 4.8 I/O 1779m UOP in last 24 hours   VITAL SIGNS: Temp:  [97.5 F (36.4 C)-98.4 F (36.9 C)] 98.4 F (36.9 C) (06/11 0732) Pulse Rate:  [87-96] 96 (06/11 0732) Resp:   [18-28] 24 (06/11 0732) BP: (112-125)/(80-89) 123/89 (06/11 0732) SpO2:  [92 %-100 %] 96 % (06/11 0732)  PHYSICAL EXAMINATION: General: Elderly lady in bed, husband at bedside,Looks more comfortable according to spouse, more interactive HEENT:scant dried bloody secretions in nares Neuro: Alert and oriented x3, moving all extremities CV: S1-S2 appreciated PULM: Decreased air movement at the bases, clear anteriorly  Recent Labs  Lab 01/12/20 0649 01/13/20 0300 01/14/20 0425  NA 129* 129* 126*  K 3.4* 3.8 3.7  CL 95* 97* 95*  CO2 '22 24 25  ' BUN 9 10 6*  CREATININE 0.66 0.67 0.43*  GLUCOSE 135* 125* 107*   Recent Labs  Lab 01/13/20 0300 01/14/20 0425 01/15/20 0507  HGB 12.7 12.0 13.2  HCT 37.2 35.4* 37.9  WBC 4.7 5.5 4.8  PLT 94* 125* 145*   DG CHEST PORT 1 VIEW  Result Date: 01/14/2020 CLINICAL DATA:  Acute respiratory failure.  Hypoxia. EXAM: PORTABLE CHEST 1 VIEW COMPARISON:  Single-view of the chest 01/13/2020 and 01/12/2020. PA and lateral chest 01/11/2020. CT chest 01/11/2020. FINDINGS: Extensive bilateral airspace disease persists. Aeration in the right upper and lower lung zones has improved but aeration in the left lung base has worsened. Small bilateral pleural effusions are seen. No pneumothorax. Heart size is normal. IMPRESSION: Multifocal pneumonia. There has been some improved aeration in the right chest but airspace disease in the left lung base is worse than on yesterday's exam. Electronically Signed   By: TInge RiseM.D.   On: 01/14/2020 09:25    ASSESSMENT / PLAN:  Acute Hypoxemic Respiratory Failure in setting of Bilateral Infiltrates Small to Moderate Bilateral Pleural Effusions CTA negative for PE but shows upper lobe predominant smooth interlobular septal thickening, with patchy peribronchovascular ground-glass densities.  DDx includes atypical PNA, aspiration with prior vomiting, rickettsial / tick borne disease given tick exposures, non-cardiogenic  pulmonary edema and pneumonitis after shingles vaccline. COVID, influenza, RVP, Legionella / Strep antigen negative. LVEF normal / grade I diastolic dysfunction.  Sister with history of RA.   -follow intermittent CXR -wean O2 for sats >90%-currently titrated down to 1 L -follow up infectious disease markers -Ehrlichia positive PCR -continue pulmicort while inpatient  -pulmonary hygiene - IS, mobilize  -outpatient pulmonary follow up arranged, will need f/u CXR to ensure resolution of infiltrates -send ESR, RF, ANA -Started on prednisone 40 mg -Chest x-ray today shows improving pleural effusions-reviewed by myself  Sepsis  Leukopenia Recent Tick Exposure  -abx per ID  -appreciate ID assistance with patient care  -Currently on Rocephin and doxycycline  Hyponatremia  Hypophosphatemia  Serum Osm 263, Urine Osm 83, Urine Na <10.  -Continue to follow electrolytes  Elevated AST/ALT -monitor LFT trend  -LFT trend is downwards -avoid hepatotoxic agents   Elevated Troponin / Demand Ischemia  -appreciate Cardiology input -no invasive work up    Sherrilyn Rist, MD [facility] Pager: [phone]

## 2020-01-15 NOTE — Progress Notes (Signed)
Looks like Ehrlichia PCR + in blood. Prednisone will be D/C'd, no role here. Continue to wean oxygen, encourage mobility. Consider f/u x-ray with PCP in 6 weeks. Will sign off, call if further questions or concerns.  Erskine Emery MD PCCM

## 2020-01-15 NOTE — Progress Notes (Signed)
CCMD called heartrate to nurse. Patient assessed and denies chest pain, shortness of breath, anxiety or other symptoms. She states she went to the bathroom about 15 minutes ago, but her heartrate has not come back down yet. She is not feeling palpitations. She is working on breathing techniques to help her relax. Will notify her MD and continue q2hr vitals per MEWS policy.  Vital Signs @MEWSNOTE @  Kelicia Youtz Oletta Darter 01/15/2020,1:51 PM    01/15/20 1346  Assess: MEWS Score  BP (!) 130/96  ECG Heart Rate (!) 143  Resp 18  Assess: MEWS Score  MEWS Temp 0  MEWS Systolic 0  MEWS Pulse 3  MEWS RR 0  MEWS LOC 0  MEWS Score 3  MEWS Score Color Yellow

## 2020-01-15 NOTE — Progress Notes (Signed)
[facility] Infectious Disease   Reason for visit: Follow up on pneumonia  Interval History: labs improving, less oxygen requirements.  No new complaints.  Husband and son at bedside.     Physical Exam: Constitutional:  Vitals:   01/15/20 0725 01/15/20 0732  BP:  123/89  Pulse:  96  Resp:  (!) 24  Temp:  98.4 F (36.9 C)  SpO2: 92% 96%   patient appears in NAD Respiratory: normal respiratory effort on 1 L [place] Cardiovascular: RRR GI: soft, nt, nd  Review of Systems: Constitutional: negative for fevers and chills Respiratory: positive for cough or dyspnea on exertion Gastrointestinal: negative for nausea and diarrhea  Lab Results  Component Value Date   WBC 4.8 01/15/2020   HGB 13.2 01/15/2020   HCT 37.9 01/15/2020   MCV 90.7 01/15/2020   PLT 145 (L) 01/15/2020    Lab Results  Component Value Date   CREATININE 0.40 (L) 01/15/2020   BUN <5 (L) 01/15/2020   NA 132 (L) 01/15/2020   K 4.0 01/15/2020   CL 99 01/15/2020   CO2 26 01/15/2020    Lab Results  Component Value Date   ALT 187 (H) 01/15/2020   AST 295 (H) 01/15/2020   ALKPHOS 382 (H) 01/15/2020     Microbiology: Recent Results (from the past 240 hour(s))  SARS Coronavirus 2 by RT PCR (hospital order, performed in [facility] hospital lab) Nasopharyngeal Nasopharyngeal Swab     Status: None   Collection Time: 01/11/20 10:21 AM   Specimen: Nasopharyngeal Swab  Result Value Ref Range Status   SARS Coronavirus 2 NEGATIVE NEGATIVE Final    Comment: (NOTE) SARS-CoV-2 target nucleic acids are NOT DETECTED. The SARS-CoV-2 RNA is generally detectable in upper and lower respiratory specimens during the acute phase of infection. The lowest concentration of SARS-CoV-2 viral copies this assay can detect is 250 copies / mL. A negative result does not preclude SARS-CoV-2 infection and should not be used as the sole basis for treatment or other patient management decisions.  A negative result may occur  with improper specimen collection / handling, submission of specimen other than nasopharyngeal swab, presence of viral mutation(s) within the areas targeted by this assay, and inadequate number of viral copies (<250 copies / mL). A negative result must be combined with clinical observations, patient history, and epidemiological information. Fact Sheet for Patients:   StrictlyIdeas.no Fact Sheet for Healthcare Providers: BankingDealers.co.za This test is not yet approved or cleared  by the [place] FDA and has been authorized for detection and/or diagnosis of SARS-CoV-2 by FDA under an Emergency Use Authorization (EUA).  This EUA will remain in effect (meaning this test can be used) for the duration of the COVID-19 declaration under Section 564(b)(1) of the Act, 21 U.S.C. section 360bbb-3(b)(1), unless the authorization is terminated or revoked sooner. Performed at [facility], [place] [address], [place], [place] [address]   Respiratory Panel by PCR     Status: None   Collection Time: 01/11/20 10:21 AM   Specimen: Nasopharyngeal Swab; Respiratory  Result Value Ref Range Status   Adenovirus NOT DETECTED NOT DETECTED Final   Coronavirus 229E NOT DETECTED NOT DETECTED Final    Comment: (NOTE) The Coronavirus on the Respiratory Panel, DOES NOT test for the novel  Coronavirus (2019 nCoV)    Coronavirus HKU1 NOT DETECTED NOT DETECTED Final   Coronavirus NL63 NOT DETECTED NOT DETECTED Final   Coronavirus OC43 NOT DETECTED NOT DETECTED Final  Metapneumovirus NOT DETECTED NOT DETECTED Final   Rhinovirus / Enterovirus NOT DETECTED NOT DETECTED Final   Influenza A NOT DETECTED NOT DETECTED Final   Influenza B NOT DETECTED NOT DETECTED Final   Parainfluenza Virus 1 NOT DETECTED NOT DETECTED Final   Parainfluenza Virus 2 NOT DETECTED NOT DETECTED Final   Parainfluenza Virus 3 NOT DETECTED NOT DETECTED Final   Parainfluenza Virus 4 NOT  DETECTED NOT DETECTED Final   Respiratory Syncytial Virus NOT DETECTED NOT DETECTED Final   Bordetella pertussis NOT DETECTED NOT DETECTED Final   Chlamydophila pneumoniae NOT DETECTED NOT DETECTED Final   Mycoplasma pneumoniae NOT DETECTED NOT DETECTED Final    Comment: Performed at [facility], [place] [address], [place], [place] [address]  Resp Panel by RT PCR (RSV, Flu A&B, Covid) - Nasopharyngeal Swab     Status: None   Collection Time: 01/11/20  4:34 PM   Specimen: Nasopharyngeal Swab  Result Value Ref Range Status   SARS Coronavirus 2 by RT PCR NEGATIVE NEGATIVE Final    Comment: (NOTE) SARS-CoV-2 target nucleic acids are NOT DETECTED. The SARS-CoV-2 RNA is generally detectable in upper respiratoy specimens during the acute phase of infection. The lowest concentration of SARS-CoV-2 viral copies this assay can detect is 131 copies/mL. A negative result does not preclude SARS-Cov-2 infection and should not be used as the sole basis for treatment or other patient management decisions. A negative result may occur with  improper specimen collection/handling, submission of specimen other than nasopharyngeal swab, presence of viral mutation(s) within the areas targeted by this assay, and inadequate number of viral copies (<131 copies/mL). A negative result must be combined with clinical observations, patient history, and epidemiological information. The expected result is Negative. Fact Sheet for Patients:  PinkCheek.be Fact Sheet for Healthcare Providers:  GravelBags.it This test is not yet ap proved or cleared by the [place] FDA and  has been authorized for detection and/or diagnosis of SARS-CoV-2 by FDA under an Emergency Use Authorization (EUA). This EUA will remain  in effect (meaning this test can be used) for the duration of the COVID-19 declaration under Section 564(b)(1) of the Act, 21 U.S.C. section  360bbb-3(b)(1), unless the authorization is terminated or revoked sooner.    Influenza A by PCR NEGATIVE NEGATIVE Final   Influenza B by PCR NEGATIVE NEGATIVE Final    Comment: (NOTE) The Xpert Xpress SARS-CoV-2/FLU/RSV assay is intended as an aid in  the diagnosis of influenza from Nasopharyngeal swab specimens and  should not be used as a sole basis for treatment. Nasal washings and  aspirates are unacceptable for Xpert Xpress SARS-CoV-2/FLU/RSV  testing. Fact Sheet for Patients: PinkCheek.be Fact Sheet for Healthcare Providers: GravelBags.it This test is not yet approved or cleared by the [place] FDA and  has been authorized for detection and/or diagnosis of SARS-CoV-2 by  FDA under an Emergency Use Authorization (EUA). This EUA will remain  in effect (meaning this test can be used) for the duration of the  Covid-19 declaration under Section 564(b)(1) of the Act, 21  U.S.C. section 360bbb-3(b)(1), unless the authorization is  terminated or revoked.    Respiratory Syncytial Virus by PCR NEGATIVE NEGATIVE Final    Comment: (NOTE) Fact Sheet for Patients: PinkCheek.be Fact Sheet for Healthcare Providers: GravelBags.it This test is not yet approved or cleared by the [place] FDA and  has been authorized for detection and/or diagnosis of SARS-CoV-2 by  FDA under an Emergency Use Authorization (EUA). This EUA will  remain  in effect (meaning this test can be used) for the duration of the  COVID-19 declaration under Section 564(b)(1) of the Act, 21 U.S.C.  section 360bbb-3(b)(1), unless the authorization is terminated or  revoked. Performed at [facility], [place] [address], [place], [place] [address]   MRSA PCR Screening     Status: None   Collection Time: 01/12/20 10:42 AM   Specimen: Nasal Mucosa; Nasopharyngeal  Result Value Ref Range Status   MRSA  by PCR NEGATIVE NEGATIVE Final    Comment:        The GeneXpert MRSA Assay (FDA approved for NASAL specimens only), is one component of a comprehensive MRSA colonization surveillance program. It is not intended to diagnose MRSA infection nor to guide or monitor treatment for MRSA infections. Performed at [facility], [place] [address], [place], [place] [address]     Impression/Plan:  1. Acute hypoxic respiratory failure - continued improvement on oxygen requirements.  Continue to wean. Ehrlichia positive on PCR so seems most consistent, though serology negative.  Could check serology in about 2 weeks to see if it is then positive. Continue doxycycline 7 days.   2.   Transaminitis - continues to improve.   3.  Thrombocytopenia - improving.  Will continue to monitor.   I will be available this weekend if needed/changes.

## 2020-01-15 NOTE — Evaluation (Signed)
Physical Therapy Evaluation Patient Details Name: Kristina Myers MRN: [id number] DOB: 04/02/1946 Today's Date: 01/15/2020   History of Present Illness  74 y.o. female admitted on 01/11/20 acute hypoxic respiratory failure with pnemonia and tick borne illness(?).  PMH arthritis.  Clinical Impression  Pt presents with an overall decrease in functional mobility, decreased cardiopulmonary endurance and decreased dynamic balance secondary to above. PTA, pt independent, living with husband and active (plays tennis, walks everyday). Today, pt able to complete bed mobility mod(I), transfer min guard and amb min(A) for safety due to decreased dynamic balance and stability. HR up to 155 with minimal activity, walking in room; required 2L O2 to maintain SpO2 88-91%. Pt would benefit from continued acute PT services to maximize functional mobility and independence prior to d/c to home.     Follow Up Recommendations Outpatient PT;Other (comment) (TBD based on change with medical status, potential for no PT if medical status changes and balance deficits clear up)    Equipment Recommendations  None recommended by PT    Recommendations for Other Services       Precautions / Restrictions Precautions Precautions: Fall Precaution Comments: Watch tachycardia      Mobility  Bed Mobility Overal bed mobility: Modified Independent                Transfers Overall transfer level: Needs assistance   Transfers: Sit to/from Stand Sit to Stand: Min guard         General transfer comment: Pt required min guard to sit to/from stand due to reports of feeling off balance and tachycardia  Ambulation/Gait Ambulation/Gait assistance: Min assist Gait Distance (Feet): 30 Feet Assistive device: None Gait Pattern/deviations: Step-through pattern;Decreased step length - right;Decreased step length - left;Decreased stride length;Wide base of support     General Gait Details: Pt demonstrated  decreased balance and stability with amb, pt required min (A) for safety and small balance corrections  Stairs            Wheelchair Mobility    Modified Rankin (Stroke Patients Only)       Balance Overall balance assessment: Needs assistance   Sitting balance-Leahy Scale: Normal       Standing balance-Leahy Scale: Fair Standing balance comment: Pt required min(A) to maintain balance in standing and with walking without assistive device.                             Pertinent Vitals/Pain Pain Assessment: No/denies pain    Home Living Family/patient expects to be discharged to:: Private residence Living Arrangements: Spouse/significant other Available Help at Discharge: Family Type of Home: Apartment Home Access: Level entry       Home Equipment: None      Prior Function Level of Independence: Independent         Comments: Plays tennis, active and walks 1-3 miles a day     Hand Dominance        Extremity/Trunk Assessment   Upper Extremity Assessment Upper Extremity Assessment: Overall WFL for tasks assessed    Lower Extremity Assessment Lower Extremity Assessment: Overall WFL for tasks assessed       Communication   Communication: No difficulties  Cognition Arousal/Alertness: Awake/alert Behavior During Therapy: WFL for tasks assessed/performed Overall Cognitive Status: Within Functional Limits for tasks assessed  General Comments General comments (skin integrity, edema, etc.): Pt spo2 85% on 1L o2, titrated to 2L to maintain O2 >88% for session. Pt tachycardic during session (was aware and cleared session with nursing prior), 155 highest HR. Pt husband and son in room, appear supportive and motivating.    Exercises     Assessment/Plan    PT Assessment Patient needs continued PT services  PT Problem List Decreased mobility;Decreased coordination;Decreased activity  tolerance;Cardiopulmonary status limiting activity;Decreased balance       PT Treatment Interventions DME instruction;Therapeutic activities;Cognitive remediation;Gait training;Therapeutic exercise;Patient/family education;Stair training;Balance training;Functional mobility training;Neuromuscular re-education    PT Goals (Current goals can be found in the Care Plan section)  Acute Rehab PT Goals Patient Stated Goal: Home PT Goal Formulation: With patient Time For Goal Achievement: 01/29/20 Potential to Achieve Goals: Good    Frequency Min 3X/week   Barriers to discharge   no barriers    Co-evaluation               AM-PAC PT "6 Clicks" Mobility  Outcome Measure Help needed turning from your back to your side while in a flat bed without using bedrails?: None Help needed moving from lying on your back to sitting on the side of a flat bed without using bedrails?: None Help needed moving to and from a bed to a chair (including a wheelchair)?: A Little Help needed standing up from a chair using your arms (e.g., wheelchair or bedside chair)?: A Little Help needed to walk in hospital room?: A Little Help needed climbing 3-5 steps with a railing? : A Lot 6 Click Score: 19    End of Session Equipment Utilized During Treatment: Gait belt;Oxygen Activity Tolerance: Treatment limited secondary to medical complications (Comment);Other (comment) (EKG team ready to see patient) Patient left: in bed;with call bell/phone within reach Nurse Communication: Mobility status;Other (comment) (vital signs during session) PT Visit Diagnosis: Unsteadiness on feet (R26.81);Other abnormalities of gait and mobility (R26.89)    Time: 8657-8469 PT Time Calculation (min) (ACUTE ONLY): 20 min   Charges:   PT Evaluation $PT Eval Moderate Complexity: 1 Mod          Alexzandrea Normington SPT 01/15/2020   Rolland Porter 01/15/2020, 4:55 PM

## 2020-01-16 DIAGNOSIS — A7741 Ehrlichiosis chafeensis [E. chafeensis]: Secondary | ICD-10-CM

## 2020-01-16 DIAGNOSIS — I471 Supraventricular tachycardia: Secondary | ICD-10-CM

## 2020-01-16 LAB — CBC
HCT: 38.1 % (ref 36.0–46.0)
Hemoglobin: 13 g/dL (ref 12.0–15.0)
MCH: 31.6 pg (ref 26.0–34.0)
MCHC: 34.1 g/dL (ref 30.0–36.0)
MCV: 92.5 fL (ref 80.0–100.0)
Platelets: 236 10*3/uL (ref 150–400)
RBC: 4.12 MIL/uL (ref 3.87–5.11)
RDW: 12.8 % (ref 11.5–15.5)
WBC: 8.6 10*3/uL (ref 4.0–10.5)
nRBC: 0 % (ref 0.0–0.2)

## 2020-01-16 LAB — COMPREHENSIVE METABOLIC PANEL
ALT: 152 U/L — ABNORMAL HIGH (ref 0–44)
AST: 189 U/L — ABNORMAL HIGH (ref 15–41)
Albumin: 2.7 g/dL — ABNORMAL LOW (ref 3.5–5.0)
Alkaline Phosphatase: 334 U/L — ABNORMAL HIGH (ref 38–126)
Anion gap: 8 (ref 5–15)
BUN: 6 mg/dL — ABNORMAL LOW (ref 8–23)
CO2: 26 mmol/L (ref 22–32)
Calcium: 8 mg/dL — ABNORMAL LOW (ref 8.9–10.3)
Chloride: 103 mmol/L (ref 98–111)
Creatinine, Ser: 0.54 mg/dL (ref 0.44–1.00)
GFR calc Af Amer: >60 mL/min (ref >60)
GFR calc non Af Amer: >60 mL/min (ref >60)
Glucose, Bld: 113 mg/dL — ABNORMAL HIGH (ref 70–99)
Potassium: 3.7 mmol/L (ref 3.5–5.1)
Sodium: 137 mmol/L (ref 135–145)
Total Bilirubin: 0.7 mg/dL (ref 0.3–1.2)
Total Protein: 5.8 g/dL — ABNORMAL LOW (ref 6.5–8.1)

## 2020-01-16 MED ORDER — METOPROLOL TARTRATE 12.5 MG HALF TABLET
12.5000 mg | ORAL_TABLET | Freq: Two times a day (BID) | ORAL | Status: DC
  Administered 2020-01-16 – 2020-01-17 (×3): 12.5 mg via ORAL
  Filled 2020-01-16 (×3): qty 1

## 2020-01-16 NOTE — Progress Notes (Signed)
Progress Note  Patient Name: Milea Klink Kommer Date of Encounter: 01/16/2020  Primary Cardiologist:  Radford Pax   Subjective   No cardiac complaints some atrial tach on telemetry passed   Inpatient Medications    Scheduled Meds: . benzonatate  200 mg Oral TID  . budesonide (PULMICORT) nebulizer solution  0.5 mg Nebulization BID  . calcium carbonate  1 tablet Oral Q breakfast  . doxycycline  100 mg Oral Q12H  . multivitamin with minerals  1 tablet Oral Daily   Continuous Infusions:  PRN Meds: ibuprofen, ipratropium-albuterol, ondansetron **OR** ondansetron (ZOFRAN) IV, oxyCODONE, polyvinyl alcohol   Vital Signs    Vitals:   01/15/20 2113 01/16/20 0300 01/16/20 0402 01/16/20 0814  BP: (!) 121/92   119/86  Pulse:    85  Resp: (!) 23 (!) 23 19 (!) 22  Temp: 97.9 F (36.6 C)   97.7 F (36.5 C)  TempSrc: Oral   Oral  SpO2: 95%   92%  Weight:      Height:        Intake/Output Summary (Last 24 hours) at 01/16/2020 0817 Last data filed at 01/16/2020 0700 Gross per 24 hour  Intake 970 ml  Output 4400 ml  Net -3430 ml   Filed Weights   01/11/20 0822  Weight: 48.5 kg    Telemetry    NSR- Personally Reviewed  ECG  No new EKG to review- Personally Reviewed  Physical Exam   GEN: Well nourished, well developed in no acute distress HEENT: Normal NECK: No JVD; No carotid bruits LYMPHATICS: No lymphadenopathy CARDIAC:RRR, no murmurs, rubs, gallops RESPIRATORY:  Crackles throughout ABDOMEN: Soft, non-tender, non-distended MUSCULOSKELETAL:  No edema; No deformity  SKIN: Warm and dry NEUROLOGIC:  Alert and oriented x 3 PSYCHIATRIC:  Normal affect    Labs    Chemistry Recent Labs  Lab 01/14/20 0425 01/15/20 0507 01/16/20 0607  NA 126* 132* 137  K 3.7 4.0 3.7  CL 95* 99 103  CO2 25 26 26   GLUCOSE 107* 159* 113*  BUN 6* <5* 6*  CREATININE 0.43* 0.40* 0.54  CALCIUM 7.3* 7.8* 8.0*  PROT 4.8* 5.8* 5.8*  ALBUMIN 2.3* 2.6* 2.7*  AST 345* 295* 189*    ALT 178* 187* 152*  ALKPHOS 379* 382* 334*  BILITOT 0.8 0.8 0.7  GFRNONAA >60 >60 >60  GFRAA >60 >60 >60  ANIONGAP 6 7 8      Hematology Recent Labs  Lab 01/14/20 0425 01/15/20 0507 01/16/20 0607  WBC 5.5 4.8 8.6  RBC 3.86* 4.18 4.12  HGB 12.0 13.2 13.0  HCT 35.4* 37.9 38.1  MCV 91.7 90.7 92.5  MCH 31.1 31.6 31.6  MCHC 33.9 34.8 34.1  RDW 12.6 12.3 12.8  PLT 125* 145* 236    Cardiac EnzymesNo results for input(s): TROPONINI in the last 168 hours. No results for input(s): TROPIPOC in the last 168 hours.   BNP Recent Labs  Lab 01/11/20 1312  BNP 66.5     DDimer  Recent Labs  Lab 01/11/20 1044  DDIMER 7.03*     Radiology    DG CHEST PORT 1 VIEW  Result Date: 01/15/2020 CLINICAL DATA:  74 year old female with history of pulmonary infiltrates. EXAM: PORTABLE CHEST 1 VIEW COMPARISON:  Chest x-ray 01/14/2020. FINDINGS: Extensive multifocal airspace consolidation, most evident in the upper lobes of the lungs bilaterally (right greater than left) where there is also widespread air bronchograms. Elevation of the minor fissure indicative of worsening right upper lobe volume loss. Medial basilar opacities  bilaterally may reflect additional areas of atelectasis and/or consolidation. Small bilateral pleural effusions. No evidence of pulmonary edema. Heart size is mildly enlarged. Aortic atherosclerosis. IMPRESSION: 1. Multilobar bilateral pneumonia, with more confluent consolidation and atelectasis in the right upper lobe than seen on the prior study. 2. Small bilateral pleural effusions have decreased slightly. 3. Aortic atherosclerosis. 4. Mild cardiomegaly. Electronically Signed   By: Vinnie Langton M.D.   On: 01/15/2020 08:57    Cardiac Studies   2D echo 01/09/2020 IMPRESSIONS    1. Left ventricular ejection fraction, by estimation, is 60 to 65%. The  left ventricle has normal function. The left ventricle has no regional  wall motion abnormalities. Left ventricular  diastolic parameters are  consistent with Grade I diastolic  dysfunction (impaired relaxation).  2. Right ventricular systolic function is normal. The right ventricular  size is normal.  3. The mitral valve is grossly normal. Trivial mitral valve  regurgitation.  4. The aortic valve is tricuspid. Aortic valve regurgitation is not  visualized.  5. The inferior vena cava is dilated in size with >50% respiratory  variability, suggesting right atrial pressure of 8 mmHg.   Patient Profile     74 y.o. female with no significant PMH who is being seen today for the evaluation of SOB at the request of Wynetta Fines, MD.  [place]    1. Acute respiratory failure with hypoxia -started after developing what sounds like a respiratory illness with chills, cough, N/V -now with hypoxia>>O2 has been weaned down to 2L with O2 sats 92% -Legionella and RMSF ruled out>>Erlichia PCR positive -BNP is normal  -chest CT neg for PE but does show bilateral pleural effusions, pulmonary edema or infection>>in setting of normal BNp in a thin patient, suspect this is not CHF and is non cardiogenic pulmonary edema -? tick borne illness given labs and other sx and tick exposure in the past week  -pulmonary following  2.  Elevated troponin -hsTrop mildly elevated with fiarly flat trend [phone]) -EKG is nonischemic and she has no CRFs except advanced age.  She has never smoked and has no fm hx of CAD -2D echo with normal LVF -this is consistent with demand ischemia -no further workup for ischemia indicated at this time  3.  Hyponatremia -Na 120 on admit and was up to 129 yesterday but down to 126  -suspect related to underlying pulmonary issue and  tick borne illness -per CCM  4.  Hepatic transaminitis  -likely related to acute Erlichia -LFTs slightly improved  -continue to follow  5.  Leukopenic/Thrombocytopenic -? Related to bone marrow suppression from acute infection -WBC up to  4.8 today and plt ct slightly improved at 145K -secondary to [place] -antibx coverage broadened with Doxycycline -appreciate ID input  6.  SVT -some atrial irritability with short runs of atrial tachycardia nothing sustained  - Dr Radford Pax note indicates adding low dose beta blocker not done yet  - will add lopressor 12.5 bid   For questions or updates, please contact [facility] Please consult [email or web address] for contact info under Cardiology/STEMI.      Signed, Jenkins Rouge, MD  01/16/2020, 8:17 AM

## 2020-01-16 NOTE — Progress Notes (Signed)
PROGRESS NOTE    Kristina Myers  EVO:[id number] DOB: 15-Jan-1946 DOA: 01/11/2020 PCP: Kelton Pillar, MD      Brief Narrative:  Kristina Myers is a 74 y.o. F with no significant PMHx who presented with dry cough, chills, headache.  Her symptoms progressively worsened until she was short of breath so she came to the ER.  In the ER, chest x-ray showed bilateral infiltrates, WBC 2.1, platelets 65, AST 908 and sodium 120.        Assessment & Plan:  Ehrlichiosis Patient recently spent a few weeks at her vacation home at [facility] in [place].  During that time she was gardening, found several ticks on her.  Subsequently developed fever, malaise, respiratory distress.  Presented with leukopenia, hyponatremia, transaminitis, and thrombocytopenia. 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   -Continue doxycycline, day 5 of 7 -Consult ID, appreciate expert guidance    Acute respiratory failure with hypoxia This appears to be pulmonary involvement of HME.     Still on 2L -Consult Pulmonlogy, appreciate cares  Elevated troponin Demand ishcemia, no further ischemic work up necessary, ACS ruled out.  Peaked at 43. -Consult cardiology, appreciate recommendations  Hyponatremia Improved  SVT Brief, nonsustained.  Agree with Dr. Radford Pax, likely acutely related to hypoxia. -Cardiology plan for watchful waiting, outpatient Zio and consideration of BB as outpatient  Liver cyst -Repeat US in 6 months          Disposition: Status is: Inpatient  Remains inpatient appropriate because: She presented with severe acute hypoxic respiratory failure, this is gradually improving but she still short of breath and tachycardic with minimal exertion, and on 2 L oxygen   Dispo:  Patient From: Home  Planned Disposition: Home  Expected discharge date: 01/18/20  Medically stable for discharge: No                        MDM: The below  labs and imaging reports reviewed and summarized above.  Medication management as above.     DVT prophylaxis: SCDs Code Status: FULL Family Communication: Husband   Consultants:   Pulm  Cards  ID  Procedures:   Echo  Antimicrobials:   Doxycycline 6/8 >>  Ceftriaxone 6/7 >>  6/11  Culture data:              Subjective: Patient cough is improving, she had no further fever, she is still out of breath, and very fatigued.  Tachypneic.       Objective: Vitals:   01/16/20 0823 01/16/20 0925 01/16/20 1417 01/16/20 1633  BP:  120/86  122/88  Pulse:    75  Resp:  (!) 21  20  Temp:    (!) 97.4 F (36.3 C)  TempSrc:    Oral  SpO2: 92%  93% 96%  Weight:      Height:        Intake/Output Summary (Last 24 hours) at 01/16/2020 1642 Last data filed at 01/16/2020 1513 Gross per 24 hour  Intake 480 ml  Output 3650 ml  Net -3170 ml   Filed Weights   01/11/20 0822  Weight: 48.5 kg    Examination: General appearance: Thin female, no acute distress, appears tired.     HEENT: Anicteric, conjunctival pink, lids and lashes normal.  No nasal deformity, discharge, or epistaxis today. Skin: No rashes Cardiac: Heart rate regular, no murmurs, no lower extremity edema Respiratory: Respirations appear somewhat dyspneic.  Rales are improved.  No  wheezing. Abdomen:   MSK: Normal muscle bulk and tone for age. Neuro: Awake and alert, extraocular movements intact, mild in the left, no change from previous, moves all extremities with generalized weakness, speech fluent Psych: Sensorium intact responding to questions, attention normal, affect normal, judgment insight normal        Data Reviewed: I have personally reviewed following labs and imaging studies:  CBC: Recent Labs  Lab 01/11/20 1044 01/11/20 1044 01/12/20 0649 01/13/20 0300 01/14/20 0425 01/15/20 0507 01/16/20 0607  WBC 1.9*   < > 2.9* 4.7 5.5 4.8 8.6  NEUTROABS 1.5*  --   --  3.4  --   --   --   HGB  12.8   < > 13.7 12.7 12.0 13.2 13.0  HCT 36.8   < > 40.3 37.2 35.4* 37.9 38.1  MCV 92.0   < > 92.6 92.5 91.7 90.7 92.5  PLT 63*   < > 75* 94* 125* 145* 236   < > = values in this interval not displayed.   Basic Metabolic Panel: Recent Labs  Lab 01/12/20 0649 01/13/20 0300 01/14/20 0425 01/15/20 0507 01/16/20 0607  NA 129* 129* 126* 132* 137  K 3.4* 3.8 3.7 4.0 3.7  CL 95* 97* 95* 99 103  CO2 22 24 25 26 26   GLUCOSE 135* 125* 107* 159* 113*  BUN 9 10 6* <5* 6*  CREATININE 0.66 0.67 0.43* 0.40* 0.54  CALCIUM 7.6* 7.3* 7.3* 7.8* 8.0*  MG  --  2.0  --   --   --   PHOS  --  1.2*  --   --   --    GFR: Estimated Creatinine Clearance: 48 mL/min (by C-G formula based on SCr of 0.54 mg/dL). Liver Function Tests: Recent Labs  Lab 01/12/20 0649 01/13/20 0300 01/14/20 0425 01/15/20 0507 01/16/20 0607  AST 751* 472* 345* 295* 189*  ALT 319* 225* 178* 187* 152*  ALKPHOS 644* 519* 379* 382* 334*  BILITOT 0.9 0.8 0.8 0.8 0.7  PROT 5.6* 5.1* 4.8* 5.8* 5.8*  ALBUMIN 2.8* 2.5* 2.3* 2.6* 2.7*   Recent Labs  Lab 01/11/20 0832  LIPASE 41   No results for input(s): AMMONIA in the last 168 hours. Coagulation Profile: Recent Labs  Lab 01/11/20 1044  INR 1.0   Cardiac Enzymes: Recent Labs  Lab 01/11/20 1253  CKTOTAL 167   BNP (last 3 results) No results for input(s): PROBNP in the last 8760 hours. HbA1C: No results for input(s): HGBA1C in the last 72 hours. CBG: No results for input(s): GLUCAP in the last 168 hours. Lipid Profile: No results for input(s): CHOL, HDL, LDLCALC, TRIG, CHOLHDL, LDLDIRECT in the last 72 hours. Thyroid Function Tests: No results for input(s): TSH, T4TOTAL, FREET4, T3FREE, THYROIDAB in the last 72 hours. Anemia Panel: No results for input(s): VITAMINB12, FOLATE, FERRITIN, TIBC, IRON, RETICCTPCT in the last 72 hours. Urine analysis:    Component Value Date/Time   COLORURINE STRAW (A) 01/11/2020 1055   APPEARANCEUR CLEAR 01/11/2020 1055    LABSPEC 1.002 (L) 01/11/2020 1055   PHURINE 7.0 01/11/2020 1055   GLUCOSEU NEGATIVE 01/11/2020 1055   HGBUR NEGATIVE 01/11/2020 1055   BILIRUBINUR NEGATIVE 01/11/2020 1055   KETONESUR NEGATIVE 01/11/2020 1055   PROTEINUR NEGATIVE 01/11/2020 1055   NITRITE NEGATIVE 01/11/2020 1055   LEUKOCYTESUR NEGATIVE 01/11/2020 1055   Sepsis Labs: @LABRCNTIP (procalcitonin:4,lacticacidven:4)  ) Recent Results (from the past 240 hour(s))  SARS Coronavirus 2 by RT PCR (hospital order, performed in [facility] hospital lab)  Nasopharyngeal Nasopharyngeal Swab     Status: None   Collection Time: 01/11/20 10:21 AM   Specimen: Nasopharyngeal Swab  Result Value Ref Range Status   SARS Coronavirus 2 NEGATIVE NEGATIVE Final    Comment: (NOTE) SARS-CoV-2 target nucleic acids are NOT DETECTED. The SARS-CoV-2 RNA is generally detectable in upper and lower respiratory specimens during the acute phase of infection. The lowest concentration of SARS-CoV-2 viral copies this assay can detect is 250 copies / mL. A negative result does not preclude SARS-CoV-2 infection and should not be used as the sole basis for treatment or other patient management decisions.  A negative result may occur with improper specimen collection / handling, submission of specimen other than nasopharyngeal swab, presence of viral mutation(s) within the areas targeted by this assay, and inadequate number of viral copies (<250 copies / mL). A negative result must be combined with clinical observations, patient history, and epidemiological information. Fact Sheet for Patients:   StrictlyIdeas.no Fact Sheet for Healthcare Providers: BankingDealers.co.za This test is not yet approved or cleared  by the [place] FDA and has been authorized for detection and/or diagnosis of SARS-CoV-2 by FDA under an Emergency Use Authorization (EUA).  This EUA will remain in effect (meaning this test can be  used) for the duration of the COVID-19 declaration under Section 564(b)(1) of the Act, 21 U.S.C. section 360bbb-3(b)(1), unless the authorization is terminated or revoked sooner. Performed at [facility], [place] [address], [place], [place] [address]   Respiratory Panel by PCR     Status: None   Collection Time: 01/11/20 10:21 AM   Specimen: Nasopharyngeal Swab; Respiratory  Result Value Ref Range Status   Adenovirus NOT DETECTED NOT DETECTED Final   Coronavirus 229E NOT DETECTED NOT DETECTED Final    Comment: (NOTE) The Coronavirus on the Respiratory Panel, DOES NOT test for the novel  Coronavirus (2019 nCoV)    Coronavirus HKU1 NOT DETECTED NOT DETECTED Final   Coronavirus NL63 NOT DETECTED NOT DETECTED Final   Coronavirus OC43 NOT DETECTED NOT DETECTED Final   Metapneumovirus NOT DETECTED NOT DETECTED Final   Rhinovirus / Enterovirus NOT DETECTED NOT DETECTED Final   Influenza A NOT DETECTED NOT DETECTED Final   Influenza B NOT DETECTED NOT DETECTED Final   Parainfluenza Virus 1 NOT DETECTED NOT DETECTED Final   Parainfluenza Virus 2 NOT DETECTED NOT DETECTED Final   Parainfluenza Virus 3 NOT DETECTED NOT DETECTED Final   Parainfluenza Virus 4 NOT DETECTED NOT DETECTED Final   Respiratory Syncytial Virus NOT DETECTED NOT DETECTED Final   Bordetella pertussis NOT DETECTED NOT DETECTED Final   Chlamydophila pneumoniae NOT DETECTED NOT DETECTED Final   Mycoplasma pneumoniae NOT DETECTED NOT DETECTED Final    Comment: Performed at [facility], [place]. [address], [place], [place] [address]  Resp Panel by RT PCR (RSV, Flu A&B, Covid) - Nasopharyngeal Swab     Status: None   Collection Time: 01/11/20  4:34 PM   Specimen: Nasopharyngeal Swab  Result Value Ref Range Status   SARS Coronavirus 2 by RT PCR NEGATIVE NEGATIVE Final    Comment: (NOTE) SARS-CoV-2 target nucleic acids are NOT DETECTED. The SARS-CoV-2 RNA is generally detectable in upper respiratoy specimens during  the acute phase of infection. The lowest concentration of SARS-CoV-2 viral copies this assay can detect is 131 copies/mL. A negative result does not preclude SARS-Cov-2 infection and should not be used as the sole basis for treatment or other patient management decisions. A  negative result may occur with  improper specimen collection/handling, submission of specimen other than nasopharyngeal swab, presence of viral mutation(s) within the areas targeted by this assay, and inadequate number of viral copies (<131 copies/mL). A negative result must be combined with clinical observations, patient history, and epidemiological information. The expected result is Negative. Fact Sheet for Patients:  PinkCheek.be Fact Sheet for Healthcare Providers:  GravelBags.it This test is not yet ap proved or cleared by the [place] FDA and  has been authorized for detection and/or diagnosis of SARS-CoV-2 by FDA under an Emergency Use Authorization (EUA). This EUA will remain  in effect (meaning this test can be used) for the duration of the COVID-19 declaration under Section 564(b)(1) of the Act, 21 U.S.C. section 360bbb-3(b)(1), unless the authorization is terminated or revoked sooner.    Influenza A by PCR NEGATIVE NEGATIVE Final   Influenza B by PCR NEGATIVE NEGATIVE Final    Comment: (NOTE) The Xpert Xpress SARS-CoV-2/FLU/RSV assay is intended as an aid in  the diagnosis of influenza from Nasopharyngeal swab specimens and  should not be used as a sole basis for treatment. Nasal washings and  aspirates are unacceptable for Xpert Xpress SARS-CoV-2/FLU/RSV  testing. Fact Sheet for Patients: PinkCheek.be Fact Sheet for Healthcare Providers: GravelBags.it This test is not yet approved or cleared by the [place] FDA and  has been authorized for detection and/or diagnosis of  SARS-CoV-2 by  FDA under an Emergency Use Authorization (EUA). This EUA will remain  in effect (meaning this test can be used) for the duration of the  Covid-19 declaration under Section 564(b)(1) of the Act, 21  U.S.C. section 360bbb-3(b)(1), unless the authorization is  terminated or revoked.    Respiratory Syncytial Virus by PCR NEGATIVE NEGATIVE Final    Comment: (NOTE) Fact Sheet for Patients: PinkCheek.be Fact Sheet for Healthcare Providers: GravelBags.it This test is not yet approved or cleared by the [place] FDA and  has been authorized for detection and/or diagnosis of SARS-CoV-2 by  FDA under an Emergency Use Authorization (EUA). This EUA will remain  in effect (meaning this test can be used) for the duration of the  COVID-19 declaration under Section 564(b)(1) of the Act, 21 U.S.C.  section 360bbb-3(b)(1), unless the authorization is terminated or  revoked. Performed at [facility], [place] [address] [place], [place] [address]   MRSA PCR Screening     Status: None   Collection Time: 01/12/20 10:42 AM   Specimen: Nasal Mucosa; Nasopharyngeal  Result Value Ref Range Status   MRSA by PCR NEGATIVE NEGATIVE Final    Comment:        The GeneXpert MRSA Assay (FDA approved for NASAL specimens only), is one component of a comprehensive MRSA colonization surveillance program. It is not intended to diagnose MRSA infection nor to guide or monitor treatment for MRSA infections. Performed at [facility], [place] [address] [place], [place] [address]          Radiology Studies: DG CHEST PORT 1 VIEW  Result Date: 01/15/2020 CLINICAL DATA:  74 year old female with history of pulmonary infiltrates. EXAM: PORTABLE CHEST 1 VIEW COMPARISON:  Chest x-ray 01/14/2020. FINDINGS: Extensive multifocal airspace consolidation, most evident in the upper lobes of the lungs bilaterally (right greater than left) where  there is also widespread air bronchograms. Elevation of the minor fissure indicative of worsening right upper lobe volume loss. Medial basilar opacities bilaterally may reflect additional areas of atelectasis and/or consolidation. Small bilateral pleural effusions.  No evidence of pulmonary edema. Heart size is mildly enlarged. Aortic atherosclerosis. IMPRESSION: 1. Multilobar bilateral pneumonia, with more confluent consolidation and atelectasis in the right upper lobe than seen on the prior study. 2. Small bilateral pleural effusions have decreased slightly. 3. Aortic atherosclerosis. 4. Mild cardiomegaly. Electronically Signed   By: Vinnie Langton M.D.   On: 01/15/2020 08:57        Scheduled Meds: . benzonatate  200 mg Oral TID  . budesonide (PULMICORT) nebulizer solution  0.5 mg Nebulization BID  . calcium carbonate  1 tablet Oral Q breakfast  . doxycycline  100 mg Oral Q12H  . metoprolol tartrate  12.5 mg Oral BID  . multivitamin with minerals  1 tablet Oral Daily   Continuous Infusions:    LOS: 5 days    Time spent: 25 minutes    Edwin Dada, MD [facility] 01/16/2020, 4:42 PM     Please page though [place] or Epic secure chat:  For [employer], Adult nurse

## 2020-01-17 LAB — CBC
HCT: 38.9 % (ref 36.0–46.0)
Hemoglobin: 13.1 g/dL (ref 12.0–15.0)
MCH: 31.3 pg (ref 26.0–34.0)
MCHC: 33.7 g/dL (ref 30.0–36.0)
MCV: 92.8 fL (ref 80.0–100.0)
Platelets: 251 10*3/uL (ref 150–400)
RBC: 4.19 MIL/uL (ref 3.87–5.11)
RDW: 12.8 % (ref 11.5–15.5)
WBC: 8.6 10*3/uL (ref 4.0–10.5)
nRBC: 0 % (ref 0.0–0.2)

## 2020-01-17 LAB — COMPREHENSIVE METABOLIC PANEL
ALT: 143 U/L — ABNORMAL HIGH (ref 0–44)
AST: 165 U/L — ABNORMAL HIGH (ref 15–41)
Albumin: 2.7 g/dL — ABNORMAL LOW (ref 3.5–5.0)
Alkaline Phosphatase: 282 U/L — ABNORMAL HIGH (ref 38–126)
Anion gap: 10 (ref 5–15)
BUN: 6 mg/dL — ABNORMAL LOW (ref 8–23)
CO2: 25 mmol/L (ref 22–32)
Calcium: 7.9 mg/dL — ABNORMAL LOW (ref 8.9–10.3)
Chloride: 100 mmol/L (ref 98–111)
Creatinine, Ser: 0.48 mg/dL (ref 0.44–1.00)
GFR calc Af Amer: >60 mL/min (ref >60)
GFR calc non Af Amer: >60 mL/min (ref >60)
Glucose, Bld: 102 mg/dL — ABNORMAL HIGH (ref 70–99)
Potassium: 3.7 mmol/L (ref 3.5–5.1)
Sodium: 135 mmol/L (ref 135–145)
Total Bilirubin: 0.5 mg/dL (ref 0.3–1.2)
Total Protein: 5.9 g/dL — ABNORMAL LOW (ref 6.5–8.1)

## 2020-01-17 MED ORDER — METOPROLOL TARTRATE 25 MG PO TABS
25.0000 mg | ORAL_TABLET | Freq: Two times a day (BID) | ORAL | Status: DC
  Administered 2020-01-17 – 2020-01-18 (×2): 25 mg via ORAL
  Filled 2020-01-17 (×2): qty 1

## 2020-01-17 NOTE — Progress Notes (Signed)
PROGRESS NOTE    Kristina Myers  OYD:[id number] DOB: 1946-04-10 DOA: 01/11/2020 PCP: Kelton Pillar, MD      Brief Narrative:  Kristina Myers is a 74 y.o. F with no significant PMHx who presented with dry cough, chills, headache.  Her symptoms progressively worsened until she was short of breath so she came to the ER.  In the ER, chest x-ray showed bilateral infiltrates, WBC 2.1, platelets 65, AST 908 and sodium 120.        Assessment & Plan:  Human monocytic ehrlichiosis Patient recently spent a few weeks at her vacation home at [facility] in [place].  During that time she was gardening, found several ticks on her.  Subsequently developed fever, malaise, which progressed to respiratory distress.  Presented with leukopenia, hyponatremia, transaminitis, and thrombocytopenia. 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    Having clinical response to doxy -Continue doxycycline, day 6 of 10 -Consult ID, appreciate expert guidance    Acute respiratory failure with hypoxia This appears to be pulmonary involvement of HME, for which treatment involves treating the underlying disease.  Still on O2, symptoms improving  Elevated troponin Demand ishcemia, no further ischemic work up necessary, ACS ruled out.  Peaked at 53. -Consult cardiology, appreciate recommendations  Hyponatremia Resolved  SVT Brief, nonsustained.  Agree with Dr. Radford Pax, likely acutely related to hypoxia. -Cardiology plan for watchful waiting, outpatient Zio and consideration of BB as outpatient  Liver cyst -Repeat US in 6 months          Disposition: Status is: Inpatient     Dispo:  Patient From: Home  Planned Disposition: Home  Expected discharge date: 01/18/20  Medically stable for discharge: No          Remains inpatient appropriate because: She presented with severe acute hypoxic respiratory failure, this is gradually improving but she still short  of breath and tachycardic with minimal exertion, and on 2 L oxygen                     MDM: The below labs and imaging reports reviewed and summarized above.  Medication management as above.      DVT prophylaxis: SCDs Code Status: FULL Family Communication: Husband   Consultants:   Pulm  Cards  ID  Procedures:   Echo  Antimicrobials:   Doxycycline 6/8 >>  Ceftriaxone 6/7 >>  6/11  Culture data:              Subjective: Patient feeling better day by day.  She is on oxygen this morning.  Still fatigued and out of breath with exertion.  But improving.  No fever, vomiting.  No rash.     Objective: Vitals:   01/16/20 1633 01/16/20 2105 01/17/20 0759 01/17/20 0806  BP: 122/88 118/90 114/89   Pulse: 75 83 70   Resp: 20 18 18 17   Temp: (!) 97.4 F (36.3 C) 98.1 F (36.7 C) 98.5 F (36.9 C)   TempSrc: Oral Oral    SpO2: 96% 96% 96% 97%  Weight:      Height:        Intake/Output Summary (Last 24 hours) at 01/17/2020 1721 Last data filed at 01/17/2020 1159 Gross per 24 hour  Intake 600 ml  Output 1650 ml  Net -1050 ml   Filed Weights   01/11/20 0822  Weight: 48.5 kg    Examination: General appearance: Thin adult female, no acute distress, sitting in recliner     HEENT:  Anicteric, conjunctival pink, lids and lashes normal.  Nasal deformity, discharge or epistaxis. Skin: Visible on exposed skin. Cardiac: RRR, no murmurs, no lower extremity edema Respiratory: Respirations even and nonlabored, no rales.  No wheezing. Abdomen:  MSK:  Neuro: Awake and alert, moves all extremities generalized weakness, speech fluent Psych: Sensorium intact responding questions, attention normal, affect normal, judgment insight appear normal        Data Reviewed: I have personally reviewed following labs and imaging studies:  CBC: Recent Labs  Lab 01/11/20 1044 01/12/20 0649 01/13/20 0300 01/14/20 0425 01/15/20 0507 01/16/20 0607  01/17/20 0443  WBC 1.9*   < > 4.7 5.5 4.8 8.6 8.6  NEUTROABS 1.5*  --  3.4  --   --   --   --   HGB 12.8   < > 12.7 12.0 13.2 13.0 13.1  HCT 36.8   < > 37.2 35.4* 37.9 38.1 38.9  MCV 92.0   < > 92.5 91.7 90.7 92.5 92.8  PLT 63*   < > 94* 125* 145* 236 251   < > = values in this interval not displayed.   Basic Metabolic Panel: Recent Labs  Lab 01/13/20 0300 01/14/20 0425 01/15/20 0507 01/16/20 0607 01/17/20 0443  NA 129* 126* 132* 137 135  K 3.8 3.7 4.0 3.7 3.7  CL 97* 95* 99 103 100  CO2 24 25 26 26 25   GLUCOSE 125* 107* 159* 113* 102*  BUN 10 6* <5* 6* 6*  CREATININE 0.67 0.43* 0.40* 0.54 0.48  CALCIUM 7.3* 7.3* 7.8* 8.0* 7.9*  MG 2.0  --   --   --   --   PHOS 1.2*  --   --   --   --    GFR: Estimated Creatinine Clearance: 48 mL/min (by C-G formula based on SCr of 0.48 mg/dL). Liver Function Tests: Recent Labs  Lab 01/13/20 0300 01/14/20 0425 01/15/20 0507 01/16/20 0607 01/17/20 0443  AST 472* 345* 295* 189* 165*  ALT 225* 178* 187* 152* 143*  ALKPHOS 519* 379* 382* 334* 282*  BILITOT 0.8 0.8 0.8 0.7 0.5  PROT 5.1* 4.8* 5.8* 5.8* 5.9*  ALBUMIN 2.5* 2.3* 2.6* 2.7* 2.7*   Recent Labs  Lab 01/11/20 0832  LIPASE 41   No results for input(s): AMMONIA in the last 168 hours. Coagulation Profile: Recent Labs  Lab 01/11/20 1044  INR 1.0   Cardiac Enzymes: Recent Labs  Lab 01/11/20 1253  CKTOTAL 167   BNP (last 3 results) No results for input(s): PROBNP in the last 8760 hours. HbA1C: No results for input(s): HGBA1C in the last 72 hours. CBG: No results for input(s): GLUCAP in the last 168 hours. Lipid Profile: No results for input(s): CHOL, HDL, LDLCALC, TRIG, CHOLHDL, LDLDIRECT in the last 72 hours. Thyroid Function Tests: No results for input(s): TSH, T4TOTAL, FREET4, T3FREE, THYROIDAB in the last 72 hours. Anemia Panel: No results for input(s): VITAMINB12, FOLATE, FERRITIN, TIBC, IRON, RETICCTPCT in the last 72 hours. Urine analysis:      Component Value Date/Time   COLORURINE STRAW (A) 01/11/2020 1055   APPEARANCEUR CLEAR 01/11/2020 1055   LABSPEC 1.002 (L) 01/11/2020 1055   PHURINE 7.0 01/11/2020 1055   GLUCOSEU NEGATIVE 01/11/2020 1055   HGBUR NEGATIVE 01/11/2020 1055   BILIRUBINUR NEGATIVE 01/11/2020 1055   KETONESUR NEGATIVE 01/11/2020 1055   PROTEINUR NEGATIVE 01/11/2020 1055   NITRITE NEGATIVE 01/11/2020 1055   LEUKOCYTESUR NEGATIVE 01/11/2020 1055   Sepsis Labs: @LABRCNTIP (procalcitonin:4,lacticacidven:4)  ) Recent Results (from the past  240 hour(s))  SARS Coronavirus 2 by RT PCR (hospital order, performed in [facility] hospital lab) Nasopharyngeal Nasopharyngeal Swab     Status: None   Collection Time: 01/11/20 10:21 AM   Specimen: Nasopharyngeal Swab  Result Value Ref Range Status   SARS Coronavirus 2 NEGATIVE NEGATIVE Final    Comment: (NOTE) SARS-CoV-2 target nucleic acids are NOT DETECTED. The SARS-CoV-2 RNA is generally detectable in upper and lower respiratory specimens during the acute phase of infection. The lowest concentration of SARS-CoV-2 viral copies this assay can detect is 250 copies / mL. A negative result does not preclude SARS-CoV-2 infection and should not be used as the sole basis for treatment or other patient management decisions.  A negative result may occur with improper specimen collection / handling, submission of specimen other than nasopharyngeal swab, presence of viral mutation(s) within the areas targeted by this assay, and inadequate number of viral copies (<250 copies / mL). A negative result must be combined with clinical observations, patient history, and epidemiological information. Fact Sheet for Patients:   StrictlyIdeas.no Fact Sheet for Healthcare Providers: BankingDealers.co.za This test is not yet approved or cleared  by the [place] FDA and has been authorized for detection and/or diagnosis of SARS-CoV-2  by FDA under an Emergency Use Authorization (EUA).  This EUA will remain in effect (meaning this test can be used) for the duration of the COVID-19 declaration under Section 564(b)(1) of the Act, 21 U.S.C. section 360bbb-3(b)(1), unless the authorization is terminated or revoked sooner. Performed at [facility], [place] [address] [place], [place] [address]   Respiratory Panel by PCR     Status: None   Collection Time: 01/11/20 10:21 AM   Specimen: Nasopharyngeal Swab; Respiratory  Result Value Ref Range Status   Adenovirus NOT DETECTED NOT DETECTED Final   Coronavirus 229E NOT DETECTED NOT DETECTED Final    Comment: (NOTE) The Coronavirus on the Respiratory Panel, DOES NOT test for the novel  Coronavirus (2019 nCoV)    Coronavirus HKU1 NOT DETECTED NOT DETECTED Final   Coronavirus NL63 NOT DETECTED NOT DETECTED Final   Coronavirus OC43 NOT DETECTED NOT DETECTED Final   Metapneumovirus NOT DETECTED NOT DETECTED Final   Rhinovirus / Enterovirus NOT DETECTED NOT DETECTED Final   Influenza A NOT DETECTED NOT DETECTED Final   Influenza B NOT DETECTED NOT DETECTED Final   Parainfluenza Virus 1 NOT DETECTED NOT DETECTED Final   Parainfluenza Virus 2 NOT DETECTED NOT DETECTED Final   Parainfluenza Virus 3 NOT DETECTED NOT DETECTED Final   Parainfluenza Virus 4 NOT DETECTED NOT DETECTED Final   Respiratory Syncytial Virus NOT DETECTED NOT DETECTED Final   Bordetella pertussis NOT DETECTED NOT DETECTED Final   Chlamydophila pneumoniae NOT DETECTED NOT DETECTED Final   Mycoplasma pneumoniae NOT DETECTED NOT DETECTED Final    Comment: Performed at [facility], [place]. [address] [place], [place] [address]  Resp Panel by RT PCR (RSV, Flu A&B, Covid) - Nasopharyngeal Swab     Status: None   Collection Time: 01/11/20  4:34 PM   Specimen: Nasopharyngeal Swab  Result Value Ref Range Status   SARS Coronavirus 2 by RT PCR NEGATIVE NEGATIVE Final    Comment: (NOTE) SARS-CoV-2 target  nucleic acids are NOT DETECTED. The SARS-CoV-2 RNA is generally detectable in upper respiratoy specimens during the acute phase of infection. The lowest concentration of SARS-CoV-2 viral copies this assay can detect is 131 copies/mL. A negative result does not preclude SARS-Cov-2 infection  and should not be used as the sole basis for treatment or other patient management decisions. A negative result may occur with  improper specimen collection/handling, submission of specimen other than nasopharyngeal swab, presence of viral mutation(s) within the areas targeted by this assay, and inadequate number of viral copies (<131 copies/mL). A negative result must be combined with clinical observations, patient history, and epidemiological information. The expected result is Negative. Fact Sheet for Patients:  PinkCheek.be Fact Sheet for Healthcare Providers:  GravelBags.it This test is not yet ap proved or cleared by the [place] FDA and  has been authorized for detection and/or diagnosis of SARS-CoV-2 by FDA under an Emergency Use Authorization (EUA). This EUA will remain  in effect (meaning this test can be used) for the duration of the COVID-19 declaration under Section 564(b)(1) of the Act, 21 U.S.C. section 360bbb-3(b)(1), unless the authorization is terminated or revoked sooner.    Influenza A by PCR NEGATIVE NEGATIVE Final   Influenza B by PCR NEGATIVE NEGATIVE Final    Comment: (NOTE) The Xpert Xpress SARS-CoV-2/FLU/RSV assay is intended as an aid in  the diagnosis of influenza from Nasopharyngeal swab specimens and  should not be used as a sole basis for treatment. Nasal washings and  aspirates are unacceptable for Xpert Xpress SARS-CoV-2/FLU/RSV  testing. Fact Sheet for Patients: PinkCheek.be Fact Sheet for Healthcare Providers: GravelBags.it This test is not  yet approved or cleared by the [place] FDA and  has been authorized for detection and/or diagnosis of SARS-CoV-2 by  FDA under an Emergency Use Authorization (EUA). This EUA will remain  in effect (meaning this test can be used) for the duration of the  Covid-19 declaration under Section 564(b)(1) of the Act, 21  U.S.C. section 360bbb-3(b)(1), unless the authorization is  terminated or revoked.    Respiratory Syncytial Virus by PCR NEGATIVE NEGATIVE Final    Comment: (NOTE) Fact Sheet for Patients: PinkCheek.be Fact Sheet for Healthcare Providers: GravelBags.it This test is not yet approved or cleared by the [place] FDA and  has been authorized for detection and/or diagnosis of SARS-CoV-2 by  FDA under an Emergency Use Authorization (EUA). This EUA will remain  in effect (meaning this test can be used) for the duration of the  COVID-19 declaration under Section 564(b)(1) of the Act, 21 U.S.C.  section 360bbb-3(b)(1), unless the authorization is terminated or  revoked. Performed at [facility], [place] [address] [place], [place] [address]   MRSA PCR Screening     Status: None   Collection Time: 01/12/20 10:42 AM   Specimen: Nasal Mucosa; Nasopharyngeal  Result Value Ref Range Status   MRSA by PCR NEGATIVE NEGATIVE Final    Comment:        The GeneXpert MRSA Assay (FDA approved for NASAL specimens only), is one component of a comprehensive MRSA colonization surveillance program. It is not intended to diagnose MRSA infection nor to guide or monitor treatment for MRSA infections. Performed at [facility], [place] [address] [place], [place] [address]          Radiology Studies: No results found.      Scheduled Meds: . benzonatate  200 mg Oral TID  . budesonide (PULMICORT) nebulizer solution  0.5 mg Nebulization BID  . calcium carbonate  1 tablet Oral Q breakfast  . doxycycline  100 mg  Oral Q12H  . metoprolol tartrate  25 mg Oral BID  . multivitamin with minerals  1 tablet Oral Daily  Continuous Infusions:    LOS: 6 days    Time spent: 5 minutes    Edwin Dada, MD [facility] 01/17/2020, 5:21 PM     Please page though [place] or Epic secure chat:  For [employer], Adult nurse

## 2020-01-17 NOTE — Progress Notes (Signed)
Slow improvement.   Completing treatment with doxycycline for Ehrlichia.   No further issues from an ID standpoint so I will sign off.   Thayer Headings, MD

## 2020-01-17 NOTE — Progress Notes (Signed)
Progress Note  Patient Name: Kristina Myers Date of Encounter: 01/17/2020  Primary Cardiologist:  Radford Pax   Subjective   Telemetry still with some PACls but patient is asymptomatic from them Discussed with daughter And husband Ambulated yesterday with no desats ? D/c in am   Inpatient Medications    Scheduled Meds: . benzonatate  200 mg Oral TID  . budesonide (PULMICORT) nebulizer solution  0.5 mg Nebulization BID  . calcium carbonate  1 tablet Oral Q breakfast  . doxycycline  100 mg Oral Q12H  . metoprolol tartrate  12.5 mg Oral BID  . multivitamin with minerals  1 tablet Oral Daily   Continuous Infusions:  PRN Meds: ibuprofen, ipratropium-albuterol, ondansetron **OR** ondansetron (ZOFRAN) IV, oxyCODONE, polyvinyl alcohol   Vital Signs    Vitals:   01/16/20 1633 01/16/20 2105 01/17/20 0759 01/17/20 0806  BP: 122/88 118/90 114/89   Pulse: 75 83 70   Resp: 20 18 18 17   Temp: (!) 97.4 F (36.3 C) 98.1 F (36.7 C) 98.5 F (36.9 C)   TempSrc: Oral Oral    SpO2: 96% 96% 96% 97%  Weight:      Height:        Intake/Output Summary (Last 24 hours) at 01/17/2020 0955 Last data filed at 01/17/2020 0945 Gross per 24 hour  Intake 720 ml  Output 2050 ml  Net -1330 ml   Filed Weights   01/11/20 0822  Weight: 48.5 kg    Telemetry    NSR- Personally Reviewed  ECG  No new EKG to review- Personally Reviewed  Physical Exam   GEN: Well nourished, well developed in no acute distress HEENT: Normal NECK: No JVD; No carotid bruits LYMPHATICS: No lymphadenopathy CARDIAC:RRR, no murmurs, rubs, gallops RESPIRATORY:  Crackles throughout ABDOMEN: Soft, non-tender, non-distended MUSCULOSKELETAL:  No edema; No deformity  SKIN: Warm and dry NEUROLOGIC:  Alert and oriented x 3 PSYCHIATRIC:  Normal affect    Labs    Chemistry Recent Labs  Lab 01/15/20 0507 01/16/20 0607 01/17/20 0443  NA 132* 137 135  K 4.0 3.7 3.7  CL 99 103 100  CO2 26 26 25     GLUCOSE 159* 113* 102*  BUN <5* 6* 6*  CREATININE 0.40* 0.54 0.48  CALCIUM 7.8* 8.0* 7.9*  PROT 5.8* 5.8* 5.9*  ALBUMIN 2.6* 2.7* 2.7*  AST 295* 189* 165*  ALT 187* 152* 143*  ALKPHOS 382* 334* 282*  BILITOT 0.8 0.7 0.5  GFRNONAA >60 >60 >60  GFRAA >60 >60 >60  ANIONGAP 7 8 10      Hematology Recent Labs  Lab 01/15/20 0507 01/16/20 0607 01/17/20 0443  WBC 4.8 8.6 8.6  RBC 4.18 4.12 4.19  HGB 13.2 13.0 13.1  HCT 37.9 38.1 38.9  MCV 90.7 92.5 92.8  MCH 31.6 31.6 31.3  MCHC 34.8 34.1 33.7  RDW 12.3 12.8 12.8  PLT 145* 236 251    Cardiac EnzymesNo results for input(s): TROPONINI in the last 168 hours. No results for input(s): TROPIPOC in the last 168 hours.   BNP Recent Labs  Lab 01/11/20 1312  BNP 66.5     DDimer  Recent Labs  Lab 01/11/20 1044  DDIMER 7.03*     Radiology    No results found.  Cardiac Studies   2D echo 01/09/2020 IMPRESSIONS    1. Left ventricular ejection fraction, by estimation, is 60 to 65%. The  left ventricle has normal function. The left ventricle has no regional  wall motion abnormalities. Left ventricular diastolic  parameters are  consistent with Grade I diastolic  dysfunction (impaired relaxation).  2. Right ventricular systolic function is normal. The right ventricular  size is normal.  3. The mitral valve is grossly normal. Trivial mitral valve  regurgitation.  4. The aortic valve is tricuspid. Aortic valve regurgitation is not  visualized.  5. The inferior vena cava is dilated in size with >50% respiratory  variability, suggesting right atrial pressure of 8 mmHg.   Patient Profile     74 y.o. female with no significant PMH who is being seen today for the evaluation of SOB at the request of Wynetta Fines, MD.  [place]    1. Acute respiratory failure with hypoxia -started after developing what sounds like a respiratory illness with chills, cough, N/V -now with hypoxia>>O2 has been weaned down to 2L with  O2 sats 92% -Legionella and RMSF ruled out>>Erlichia PCR positive -BNP is normal  -chest CT neg for PE but does show bilateral pleural effusions, pulmonary edema or infection>>in setting of normal BNp in a thin patient, suspect this is not CHF and is non cardiogenic pulmonary edema -? tick borne illness given labs and other sx and tick exposure in the past week  -pulmonary following  2.  Elevated troponin -hsTrop mildly elevated with fiarly flat trend [phone]) -EKG is nonischemic and she has no CRFs except advanced age.  She has never smoked and has no fm hx of CAD -2D echo with normal LVF -this is consistent with demand ischemia -no further workup for ischemia indicated at this time  3.  Hyponatremia -Na 120 on admit and was up to 129 yesterday but down to 126  -suspect related to underlying pulmonary issue and  tick borne illness -per CCM  4.  Hepatic transaminitis  -likely related to acute Erlichia -LFTs slightly improved  -continue to follow  5.  Leukopenic/Thrombocytopenic -? Related to bone marrow suppression from acute infection -WBC up to 4.8 today and plt ct slightly improved at 145K -secondary to [place] -antibx coverage broadened with Doxycycline -appreciate ID input  6.  SVT -some atrial irritability with short runs of atrial tachycardia nothing sustained  - titrate/continue lopressor 25 bid   For questions or updates, please contact [place] Please consult [email or web address] for contact info under Cardiology/STEMI.      Signed, Jenkins Rouge, MD  01/17/2020, 9:55 AM

## 2020-01-17 NOTE — Evaluation (Signed)
SATURATION QUALIFICATIONS: (This note is used to comply with regulatory documentation for home oxygen)  Patient Saturations on Room Air at Rest = 98%  Patient Saturations on Room Air while Ambulating = 96%  Patient Saturations on oxygen while Ambulating = deferred, O2 not needed.

## 2020-01-18 DIAGNOSIS — J80 Acute respiratory distress syndrome: Secondary | ICD-10-CM

## 2020-01-18 DIAGNOSIS — A7740 Ehrlichiosis, unspecified: Secondary | ICD-10-CM

## 2020-01-18 MED ORDER — DOXYCYCLINE HYCLATE 100 MG PO TABS: 100.0000 mg | ORAL_TABLET | Freq: Two times a day (BID) | ORAL | 0 refills | Status: DC

## 2020-01-18 NOTE — TOC Progression Note (Signed)
Transition of Care [facility]) - Progression Note    Patient Details  Name: Melessia Kaus MRN: [id number] Date of Birth: 1946-06-16  [facility] of [facility] CM/SW [place], [place] Phone Number: 01/18/2020, 9:35 AM  Clinical Narrative:     CSW confirmed pt okay with OP PT rec/referral. OP PT referral sent to [facility]. Location.         Expected Discharge Plan and [place] with OP PT       Expected Discharge Date: 01/18/20                                     Social Determinants of Health (SDOH) Interventions    Readmission Risk Interventions No flowsheet data found.

## 2020-01-18 NOTE — Progress Notes (Signed)
OT Screen Note  Patient Details Name: [name] MRN: [id number] DOB: [birth year]   Cancelled Treatment:    Reason Eval/Treat Not Completed: OT screened, no needs identified, will sign off (energy conservation addressed by PT Ashly) Spoke with PT Ashly and all needs have been met at this time. OT to screen patient and d/c   Jeri Modena 01/18/2020, 9:07 AM

## 2020-01-18 NOTE — Progress Notes (Signed)
Physical Therapy Treatment Patient Details Name: Kristina Myers MRN: [id number] DOB: 08-28-1945 Today's Date: 01/18/2020    History of Present Illness 74 y.o. female admitted on 01/11/20 acute hypoxic respiratory failure with pnemonia and tick borne illness(?).  PMH arthritis.    PT Comments    Pt continue to progress towards all goals. Pts SpO2 >94% on RA t/o session. Pt scored 22/24 on DGI indicating minimal falls risk. Pt reports she feels she is at 50% of baseline level of function and energy. Pt reports "my balance is way off compared to my normal." Pt desires to participate in outpt PT to regain balance and strength to return to tennis and other activities she did PTA. Acute PT to cont to follow.   Follow Up Recommendations  Outpatient PT (for balance as pt was active and playing tennis)     Equipment Recommendations  None recommended by PT    Recommendations for Other Services       Precautions / Restrictions Precautions Precautions: Fall Restrictions Weight Bearing Restrictions: No    Mobility  Bed Mobility Overal bed mobility: Modified Independent             General bed mobility comments: HOB elevated  Transfers Overall transfer level: Needs assistance Equipment used: None Transfers: Sit to/from Stand Sit to Stand: Supervision         General transfer comment: good technique  Ambulation/Gait Ambulation/Gait assistance: Min guard Gait Distance (Feet): 200 Feet Assistive device: None Gait Pattern/deviations: Step-through pattern;Decreased stride length;Narrow base of support Gait velocity: mildly slower than normal Gait velocity interpretation: 1.31 - 2.62 ft/sec, indicative of limited community ambulator General Gait Details: pt with mild instability, mild lateral staggering/vearing but able to catch self without external assist   Stairs Stairs: Yes Stairs assistance: Min assist Stair Management: Alternating pattern Number of  Stairs: 4 General stair comments: L HHA as pt with no hand rails at her steps at home   Wheelchair Mobility    Modified Rankin (Stroke Patients Only)       Balance Overall balance assessment: Needs assistance   Sitting balance-Leahy Scale: Normal     Standing balance support: No upper extremity supported Standing balance-Leahy Scale: Good Standing balance comment: pt able to wash hands and put in contact without external support or support of UE                            Cognition Arousal/Alertness: Awake/alert Behavior During Therapy: WFL for tasks assessed/performed Overall Cognitive Status: Within Functional Limits for tasks assessed                                        Exercises Other Exercises Other Exercises: discussed energy conservation    General Comments General comments (skin integrity, edema, etc.): Pt SpO2 >94% on RA t/o PT session      Pertinent Vitals/Pain Pain Assessment: No/denies pain    Home Living                      Prior Function            PT Goals (current goals can now be found in the care plan section) Progress towards PT goals: Progressing toward goals    Frequency    Min 3X/week      PT Plan Current plan remains appropriate  Co-evaluation              AM-PAC PT "6 Clicks" Mobility   Outcome Measure  Help needed turning from your back to your side while in a flat bed without using bedrails?: None Help needed moving from lying on your back to sitting on the side of a flat bed without using bedrails?: None Help needed moving to and from a bed to a chair (including a wheelchair)?: None Help needed standing up from a chair using your arms (e.g., wheelchair or bedside chair)?: None Help needed to walk in hospital room?: A Little Help needed climbing 3-5 steps with a railing? : A Little 6 Click Score: 22    End of Session Equipment Utilized During Treatment: Gait  belt Activity Tolerance: Patient tolerated treatment well Patient left: in chair Nurse Communication: Mobility status PT Visit Diagnosis: Unsteadiness on feet (R26.81);Other abnormalities of gait and mobility (R26.89)     Time: 0720-0740 PT Time Calculation (min) (ACUTE ONLY): 20 min  Charges:  $Gait Training: 8-22 mins                     Kittie Plater, PT, DPT Acute Rehabilitation Services Pager #: [phone] Office #: [phone]    Berline Lopes 01/18/2020, 7:50 AM

## 2020-01-18 NOTE — Discharge Summary (Signed)
Physician Discharge Summary  Kristina Myers HYI:[id number] DOB: Jun 18, 1946 DOA: 01/11/2020  PCP: Kelton Pillar, MD  Admit date: 01/11/2020 Discharge date: 01/18/2020  Admitted From: Home  Disposition:  Home   Recommendations for Outpatient Follow-up:  1. Follow up with PCP Dr. Laurann Montana in 1 week 2. Follow up with Pulmonology as scheduled 3. Follow up with Cardiology Dr. Radford Pax for eval of SVT in 4-6 weeks 4. Dr. Laurann Montana: Please repeat LFTs to document resolution in 3-4 weeks 5. Dr. Laurann Montana: Please obtain repeat liver ultrasound in 6 weeks to reevaluate liver cyst      Home Health: None  Equipment/Devices: None  Discharge Condition: Good  CODE STATUS: FULL Diet recommendation: Regular  Brief/Interim Summary: [name] is a 74 y.o. F with no significant past medical history who presented with several days dry cough, chills, malaise, and headache.  Her symptoms progressed to shortness of breath and she came to the urgent care where she was found to be hypoxic and sent to the ER.  In the ER, chest x-ray showed bilateral infiltrates, WBC 2.1, platelets 60 5K, AST 908, and sodium 120.  He was hypoxic to the 70s and admitted for broad-spectrum antibiotics.     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  During that time she was gardening, found several ticks on her.  Subsequently developed fever, malaise, which progressed to respiratory distress.  Presented with leukopenia, hyponatremia, transaminitis, and thrombocytopenia. 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    Started on IV doxycycline, and had good clinical response.  Weaned from 15 L oxygen to room air.  Platelets, WBC normalized.  Discharged to complete 10 days of doxycycline.    Acute respiratory failure with  hypoxia due to ARDS from ehrlichiosis Patient's respiratory failure was characterized by bilateral infiltrates, severe hypoxia, and respiratory distress.  This appears to be pulmonary involvement of HME.  Congestive heart failure was ruled out, she was treated for the underlying ehrlichiosis with doxycycline, and her respiratory involvement completely resolved.  On the day of discharge she was ambulatory without hypoxia or dyspnea.  Elevated troponin Demand ishcemia, no further ischemic work up necessary, ACS ruled out.  Peaked at 900.  Hyponatremia Resolved  SVT Patient had several episodes of brief nonsustained tachycardia.  She was evaluated by cardiology, Dr. Radford Pax, who felt this is likely to be related to hypoxia, self-limited, and did not recommend nodal blocking agents.  She recommended outpatient follow-up, 2-week Zio patch, and reevaluation after cardiac monitoring.    Liver cyst Incidental finding, unclear clinical significance.  Repeat US liver in 6 months          Discharge Instructions  Discharge Instructions    Ambulatory referral to Physical Therapy   Complete by: As directed     Outpatient PT (for balance as pt was active and playing tennis)   Discharge instructions   Complete by: As directed    From Dr. Loleta Books: You were admitted for Ehrlichia infection.  You tested positive for Ehrlichia chaffeensis.  This is a tick-borne illness, as we discussed, and your illness and labwork was typical of cases described in the literature.    You were treated with doxycycline. You should complete 10 days total of doxycycline  Call Dr. Laurann Montana for a follow up appointment in about 1-2 weeks Ask her to check your liver  function tests in a few weeks  The pulmonology team have arranged a follow up for you later this month.  The date and time is listed below in the To Do section   Dr. Radford Pax has also recommended you follow up with her after discharge  I have sent her  office a message to schedule you for an appoitnment and heart monitor.  If you haven't heard from them in a week or so, call the number listed below.   Increase activity slowly   Complete by: As directed      Allergies as of 01/18/2020      Reactions   Penicillins       Medication List    TAKE these medications   CALCIUM PO Take 1 tablet by mouth daily.   doxycycline 100 MG tablet Commonly known as: VIBRA-TABS Take 1 tablet (100 mg total) by mouth every 12 (twelve) hours.   MULTIVITAMIN ADULT PO Take 1 tablet by mouth daily.   ondansetron 4 MG disintegrating tablet Commonly known as: ZOFRAN-ODT Take 4 mg by mouth every 8 (eight) hours as needed for nausea or vomiting.   TURMERIC PO Take 1 tablet by mouth daily.       Follow-up Information    Parrett, Fonnie Mu, NP Follow up on 02/01/2020.   Specialty: Pulmonary Disease Why: Appt at 11:00 AM.  Please arrive at 10:45 am for check in.  Contact information: [address] [place] [address] [phone]        Kelton Pillar, MD. Schedule an appointment as soon as possible for a visit in 1 week(s).   Specialty: Family Medicine Contact information: [address] [place] [address] [phone]        Sueanne Margarita, MD. Go in 1 month(s).   Specialty: Cardiology Contact information: [address] [place] [address] [phone]              Allergies  Allergen Reactions  . Penicillins     Consultations:  Pulmonology  ID  Cardiology   Procedures/Studies: DG Chest 1 View  Result Date: 01/12/2020 CLINICAL DATA:  Pneumonia, increasing work of breathing EXAM: CHEST  1 VIEW COMPARISON:  CT and radiograph 01/11/2020 FINDINGS: Increasing airspace opacity in the mid to upper lungs and right lung base. Bilateral pleural effusions are again seen. Background of coarse reticular interstitial opacity throughout both lungs. Cardiomediastinal contours are  stable. The aorta is calcified. The remaining cardiomediastinal contours are unremarkable. No acute osseous or soft tissue abnormality. Degenerative changes are present in the imaged spine and shoulders. Telemetry leads overlie the chest. IMPRESSION: Increasing airspace opacity in the mid to upper lungs and right lung base which could reflect edema and/or infection. Similar distribution to the recent CT. Bilateral effusions. Aortic Atherosclerosis (ICD10-I70.0). Electronically Signed   By: Lovena Le M.D.   On: 01/12/2020 06:00   DG Chest 2 View  Result Date: 01/11/2020 CLINICAL DATA:  Cough, nausea, headache, recent shingles vaccine. EXAM: CHEST - 2 VIEW COMPARISON:  09/14/2013 chest radiograph. FINDINGS: Stable cardiomediastinal silhouette with normal heart size. No pneumothorax. No pleural effusion. New diffuse streaky parahilar opacities throughout both lungs. IMPRESSION: New diffuse streaky parahilar opacities throughout both lungs, which could represent atypical pneumonia or pulmonary edema. Recommend follow-up chest radiographs. Electronically Signed   By: Ilona Sorrel M.D.   On: 01/11/2020 08:45   CT Angio Chest PE W and/or Wo Contrast  Result Date: 01/11/2020 CLINICAL DATA:  Cough, chills, and vomiting. EXAM: CT ANGIOGRAPHY CHEST WITH CONTRAST TECHNIQUE: Multidetector CT imaging of the chest was performed using the standard protocol during bolus administration of intravenous contrast. Multiplanar CT image reconstructions and MIPs were obtained to evaluate the vascular anatomy. CONTRAST:  69mL OMNIPAQUE IOHEXOL 350 MG/ML SOLN COMPARISON:  Chest x-ray from same day. FINDINGS: Cardiovascular: Satisfactory opacification of the pulmonary arteries to the segmental level. No evidence of pulmonary embolism. Normal heart size. No pericardial effusion. No thoracic aortic aneurysm or dissection. Mediastinum/Nodes: No enlarged mediastinal, hilar, or axillary lymph nodes. Tiny subcentimeter hypodense nodules and  calcifications in the thyroid gland. Not clinically significant; no follow-up imaging recommended. Trachea and esophagus demonstrate no significant findings. Lungs/Pleura: Upper lobe predominant smooth interlobular septal thickening. Upper lobe predominant patchy and confluent peribronchovascular ground-glass densities. Dependent atelectasis in both lower lobes. Small to moderate bilateral pleural effusions. No pneumothorax. Upper Abdomen: Small perihepatic ascites. Air anterior to the liver within largely decompressed transverse colon. Musculoskeletal: No chest wall abnormality. No acute or significant osseous findings. Review of the MIP images confirms the above findings. IMPRESSION: 1. No evidence of pulmonary embolism. 2. Upper lobe predominant smooth interlobular septal thickening with patchy and confluent peribronchovascular ground-glass densities, concerning for pulmonary edema, less likely atypical infection. 3. Small to moderate bilateral pleural effusions. 4. Small perihepatic ascites. Electronically Signed   By: Titus Dubin M.D.   On: 01/11/2020 15:18   DG CHEST PORT 1 VIEW  Result Date: 01/15/2020 CLINICAL DATA:  74 year old female with history of pulmonary infiltrates. EXAM: PORTABLE CHEST 1 VIEW COMPARISON:  Chest x-ray 01/14/2020. FINDINGS: Extensive multifocal airspace consolidation, most evident in the upper lobes of the lungs bilaterally (right greater than left) where there is also widespread air bronchograms. Elevation of the minor fissure indicative of worsening right upper lobe volume loss. Medial basilar opacities bilaterally may reflect additional areas of atelectasis and/or consolidation. Small bilateral pleural effusions. No evidence of pulmonary edema. Heart size is mildly enlarged. Aortic atherosclerosis. IMPRESSION: 1. Multilobar bilateral pneumonia, with more confluent consolidation and atelectasis in the right upper lobe than seen on the prior study. 2. Small bilateral pleural  effusions have decreased slightly. 3. Aortic atherosclerosis. 4. Mild cardiomegaly. Electronically Signed   By: Vinnie Langton M.D.   On: 01/15/2020 08:57   DG CHEST PORT 1 VIEW  Result Date: 01/14/2020 CLINICAL DATA:  Acute respiratory failure.  Hypoxia. EXAM: PORTABLE CHEST 1 VIEW COMPARISON:  Single-view of the chest 01/13/2020 and 01/12/2020. PA and lateral chest 01/11/2020. CT chest 01/11/2020. FINDINGS: Extensive bilateral airspace disease persists. Aeration in the right upper and lower lung zones has improved but aeration in the left lung base has worsened. Small bilateral pleural effusions are seen. No pneumothorax. Heart size is normal. IMPRESSION: Multifocal pneumonia. There has been some improved aeration in the right chest but airspace disease in the left lung base is worse than on yesterday's exam. Electronically Signed   By: Inge Rise M.D.   On: 01/14/2020 09:25   DG Chest Port 1 View  Result Date: 01/13/2020 CLINICAL DATA:  Respiratory distress EXAM: PORTABLE CHEST 1 VIEW COMPARISON:  January 12, 2020 FINDINGS: There is progression of consolidation in the right upper lobe compared to 1 day prior. There is stable airspace opacity throughout much of the left upper lobe as well as in the right base. There are pleural effusions bilaterally, similar to 1 day prior. Heart size normal. No adenopathy evident. Pulmonary vascularity within normal limits. No bone lesions. IMPRESSION: Multifocal airspace opacity with  increase in consolidation in the right upper lobe. Other areas of airspace opacity appear stable compared to 1 day prior. Small pleural effusions bilaterally. Stable cardiac silhouette. Electronically Signed   By: Lowella Grip III M.D.   On: 01/13/2020 09:28   ECHOCARDIOGRAM COMPLETE  Result Date: 01/11/2020    ECHOCARDIOGRAM REPORT   Patient Name:   CHARLYNN SALIH Date of Exam: 01/11/2020 Medical Rec #:  [id number]                 Height:       62.0 in Accession #:     [id number]                Weight:       107.0 lb Date of Birth:  03/23/1946                BSA:          1.465 m Patient Age:    10 years                  BP:           105/70 mmHg Patient Gender: F                         HR:           96 bpm. Exam Location:  Inpatient Procedure: 2D Echo Indications:    Dyspnea 786.09 / R06.00  History:        Patient has no prior history of Echocardiogram examinations.                 Risk Factors:Non-Smoker. Pneumonia.  Sonographer:    Leavy Cella Referring Phys: [id number] [place]  1. Left ventricular ejection fraction, by estimation, is 60 to 65%. The left ventricle has normal function. The left ventricle has no regional wall motion abnormalities. Left ventricular diastolic parameters are consistent with Grade I diastolic dysfunction (impaired relaxation).  2. Right ventricular systolic function is normal. The right ventricular size is normal.  3. The mitral valve is grossly normal. Trivial mitral valve regurgitation.  4. The aortic valve is tricuspid. Aortic valve regurgitation is not visualized.  5. The inferior vena cava is dilated in size with >50% respiratory variability, suggesting right atrial pressure of 8 mmHg. FINDINGS  Left Ventricle: Left ventricular ejection fraction, by estimation, is 60 to 65%. The left ventricle has normal function. The left ventricle has no regional wall motion abnormalities. The left ventricular internal cavity size was normal in size. There is  no left ventricular hypertrophy. Left ventricular diastolic parameters are consistent with Grade I diastolic dysfunction (impaired relaxation). Indeterminate filling pressures. Right Ventricle: The right ventricular size is normal. No increase in right ventricular wall thickness. Right ventricular systolic function is normal. Left Atrium: Left atrial size was normal in size. Right Atrium: Right atrial size was normal in size. Pericardium: There is no evidence of pericardial effusion.  Mitral Valve: The mitral valve is grossly normal. Trivial mitral valve regurgitation. Tricuspid Valve: The tricuspid valve is grossly normal. Tricuspid valve regurgitation is trivial. Aortic Valve: The aortic valve is tricuspid. Aortic valve regurgitation is not visualized. Pulmonic Valve: The pulmonic valve was normal in structure. Pulmonic valve regurgitation is not visualized. Aorta: The aortic root and ascending aorta are structurally normal, with no evidence of dilitation. Venous: The inferior vena cava is dilated in size with greater than 50% respiratory variability, suggesting right atrial pressure  of 8 mmHg. IAS/Shunts: No atrial level shunt detected by color flow Doppler. Additional Comments: There is a small pleural effusion in the right lateral region. Mild ascites is present.  LEFT VENTRICLE PLAX 2D LVIDd:         4.80 cm  Diastology LVIDs:         3.40 cm  LV e' lateral:   8.05 cm/s LV PW:         0.80 cm  LV E/e' lateral: 10.2 LV IVS:        0.90 cm  LV e' medial:    8.16 cm/s LVOT diam:     1.90 cm  LV E/e' medial:  10.0 LVOT Area:     2.84 cm  RIGHT VENTRICLE RV S prime:     21.30 cm/s TAPSE (M-mode): 2.2 cm LEFT ATRIUM           Index LA diam:      2.40 cm 1.64 cm/m LA Vol (A2C): 49.8 ml 33.98 ml/m LA Vol (A4C): 39.2 ml 26.75 ml/m   AORTA Ao Root diam: 3.00 cm MITRAL VALVE MV Area (PHT): 5.27 cm    SHUNTS MV Decel Time: 144 msec    Systemic Diam: 1.90 cm MV E velocity: 82.00 cm/s MV A velocity: 42.70 cm/s MV E/A ratio:  1.92 Lyman Bishop MD Electronically signed by Lyman Bishop MD Signature Date/Time: 01/11/2020/4:49:56 PM    Final    US Abdomen Limited RUQ  Result Date: 01/11/2020 CLINICAL DATA:  Nausea and right upper quadrant pain since Thursday. EXAM: ULTRASOUND ABDOMEN LIMITED RIGHT UPPER QUADRANT COMPARISON:  None. FINDINGS: Gallbladder: Sludge. No gallstones. Asymmetric wall thickening and edema measuring up to 9 mm. No sonographic Murphy sign noted by sonographer. Common bile duct:  Diameter: 5 mm, normal. Liver: 7 x 9 x 8 mm echogenic lesion in the right. Within normal limits in parenchymal echogenicity. Portal vein is patent on color Doppler imaging with normal direction of blood flow towards the liver. Other: Small perihepatic ascites.  Small right pleural effusion. IMPRESSION: 1. Gallbladder sludge with asymmetric wall thickening and edema. Given small perihepatic ascites, small right pleural effusion, and elevated LFTs, the gallbladder appearance is favored related to underlying liver disease, although acalculus cholecystitis is not entirely excluded. 2. Small 0.9 cm hyperechoic lesion in the right lobe of the liver, probably a hemangioma, benign. If definitive characterization is required, consider follow-up MRI abdomen without and with MultiHance in 6 months. Electronically Signed   By: Titus Dubin M.D.   On: 01/11/2020 12:42       Subjective: Patient is feeling well.  She has no cough, dyspnea with exertion.  No further fever.  No confusion, focal weakness.  Discharge Exam: Vitals:   01/18/20 0749 01/18/20 0817  BP:  97/77  Pulse:  82  Resp: (!) 21 19  Temp:  98.6 F (37 C)  SpO2: 96% 95%   Vitals:   01/17/20 1950 01/17/20 2230 01/18/20 0749 01/18/20 0817  BP:  115/78  97/77  Pulse:  82  82  Resp:  14 (!) 21 19  Temp:  98.4 F (36.9 C)  98.6 F (37 C)  TempSrc:  Oral    SpO2: 96% 97% 96% 95%  Weight:      Height:        General: Pt is alert, awake, not in acute distress Cardiovascular: RRR, nl S1-S2, no murmurs appreciated.   No LE edema.   Respiratory: Normal respiratory rate and rhythm.  CTAB without rales  or wheezes. Abdominal: Abdomen soft and non-tender.  No distension or HSM.   Neuro/Psych: Strength symmetric in upper and lower extremities.  Judgment and insight appear normal.   The results of significant diagnostics from this hospitalization (including imaging, microbiology, ancillary and laboratory) are listed below for reference.      Microbiology: Recent Results (from the past 240 hour(s))  SARS Coronavirus 2 by RT PCR (hospital order, performed in [facility] hospital lab) Nasopharyngeal Nasopharyngeal Swab     Status: None   Collection Time: 01/11/20 10:21 AM   Specimen: Nasopharyngeal Swab  Result Value Ref Range Status   SARS Coronavirus 2 NEGATIVE NEGATIVE Final    Comment: (NOTE) SARS-CoV-2 target nucleic acids are NOT DETECTED. The SARS-CoV-2 RNA is generally detectable in upper and lower respiratory specimens during the acute phase of infection. The lowest concentration of SARS-CoV-2 viral copies this assay can detect is 250 copies / mL. A negative result does not preclude SARS-CoV-2 infection and should not be used as the sole basis for treatment or other patient management decisions.  A negative result may occur with improper specimen collection / handling, submission of specimen other than nasopharyngeal swab, presence of viral mutation(s) within the areas targeted by this assay, and inadequate number of viral copies (<250 copies / mL). A negative result must be combined with clinical observations, patient history, and epidemiological information. Fact Sheet for Patients:   StrictlyIdeas.no Fact Sheet for Healthcare Providers: BankingDealers.co.za This test is not yet approved or cleared  by the [place] FDA and has been authorized for detection and/or diagnosis of SARS-CoV-2 by FDA under an Emergency Use Authorization (EUA).  This EUA will remain in effect (meaning this test can be used) for the duration of the COVID-19 declaration under Section 564(b)(1) of the Act, 21 U.S.C. section 360bbb-3(b)(1), unless the authorization is terminated or revoked sooner. Performed at [facility], [place] [address] [place], [place] [address]   Respiratory Panel by PCR     Status: None   Collection Time: 01/11/20 10:21 AM   Specimen: Nasopharyngeal  Swab; Respiratory  Result Value Ref Range Status   Adenovirus NOT DETECTED NOT DETECTED Final   Coronavirus 229E NOT DETECTED NOT DETECTED Final    Comment: (NOTE) The Coronavirus on the Respiratory Panel, DOES NOT test for the novel  Coronavirus (2019 nCoV)    Coronavirus HKU1 NOT DETECTED NOT DETECTED Final   Coronavirus NL63 NOT DETECTED NOT DETECTED Final   Coronavirus OC43 NOT DETECTED NOT DETECTED Final   Metapneumovirus NOT DETECTED NOT DETECTED Final   Rhinovirus / Enterovirus NOT DETECTED NOT DETECTED Final   Influenza A NOT DETECTED NOT DETECTED Final   Influenza B NOT DETECTED NOT DETECTED Final   Parainfluenza Virus 1 NOT DETECTED NOT DETECTED Final   Parainfluenza Virus 2 NOT DETECTED NOT DETECTED Final   Parainfluenza Virus 3 NOT DETECTED NOT DETECTED Final   Parainfluenza Virus 4 NOT DETECTED NOT DETECTED Final   Respiratory Syncytial Virus NOT DETECTED NOT DETECTED Final   Bordetella pertussis NOT DETECTED NOT DETECTED Final   Chlamydophila pneumoniae NOT DETECTED NOT DETECTED Final   Mycoplasma pneumoniae NOT DETECTED NOT DETECTED Final    Comment: Performed at [facility], [place]. [address] [place], [place] [address]  Resp Panel by RT PCR (RSV, Flu A&B, Covid) - Nasopharyngeal Swab     Status: None   Collection Time: 01/11/20  4:34 PM   Specimen: Nasopharyngeal Swab  Result Value Ref Range Status  SARS Coronavirus 2 by RT PCR NEGATIVE NEGATIVE Final    Comment: (NOTE) SARS-CoV-2 target nucleic acids are NOT DETECTED. The SARS-CoV-2 RNA is generally detectable in upper respiratoy specimens during the acute phase of infection. The lowest concentration of SARS-CoV-2 viral copies this assay can detect is 131 copies/mL. A negative result does not preclude SARS-Cov-2 infection and should not be used as the sole basis for treatment or other patient management decisions. A negative result may occur with  improper specimen collection/handling, submission of  specimen other than nasopharyngeal swab, presence of viral mutation(s) within the areas targeted by this assay, and inadequate number of viral copies (<131 copies/mL). A negative result must be combined with clinical observations, patient history, and epidemiological information. The expected result is Negative. Fact Sheet for Patients:  PinkCheek.be Fact Sheet for Healthcare Providers:  GravelBags.it This test is not yet ap proved or cleared by the [place] FDA and  has been authorized for detection and/or diagnosis of SARS-CoV-2 by FDA under an Emergency Use Authorization (EUA). This EUA will remain  in effect (meaning this test can be used) for the duration of the COVID-19 declaration under Section 564(b)(1) of the Act, 21 U.S.C. section 360bbb-3(b)(1), unless the authorization is terminated or revoked sooner.    Influenza A by PCR NEGATIVE NEGATIVE Final   Influenza B by PCR NEGATIVE NEGATIVE Final    Comment: (NOTE) The Xpert Xpress SARS-CoV-2/FLU/RSV assay is intended as an aid in  the diagnosis of influenza from Nasopharyngeal swab specimens and  should not be used as a sole basis for treatment. Nasal washings and  aspirates are unacceptable for Xpert Xpress SARS-CoV-2/FLU/RSV  testing. Fact Sheet for Patients: PinkCheek.be Fact Sheet for Healthcare Providers: GravelBags.it This test is not yet approved or cleared by the [place] FDA and  has been authorized for detection and/or diagnosis of SARS-CoV-2 by  FDA under an Emergency Use Authorization (EUA). This EUA will remain  in effect (meaning this test can be used) for the duration of the  Covid-19 declaration under Section 564(b)(1) of the Act, 21  U.S.C. section 360bbb-3(b)(1), unless the authorization is  terminated or revoked.    Respiratory Syncytial Virus by PCR NEGATIVE NEGATIVE Final     Comment: (NOTE) Fact Sheet for Patients: PinkCheek.be Fact Sheet for Healthcare Providers: GravelBags.it This test is not yet approved or cleared by the [place] FDA and  has been authorized for detection and/or diagnosis of SARS-CoV-2 by  FDA under an Emergency Use Authorization (EUA). This EUA will remain  in effect (meaning this test can be used) for the duration of the  COVID-19 declaration under Section 564(b)(1) of the Act, 21 U.S.C.  section 360bbb-3(b)(1), unless the authorization is terminated or  revoked. Performed at [facility], [place] [address], [place], [place] [address]   MRSA PCR Screening     Status: None   Collection Time: 01/12/20 10:42 AM   Specimen: Nasal Mucosa; Nasopharyngeal  Result Value Ref Range Status   MRSA by PCR NEGATIVE NEGATIVE Final    Comment:        The GeneXpert MRSA Assay (FDA approved for NASAL specimens only), is one component of a comprehensive MRSA colonization surveillance program. It is not intended to diagnose MRSA infection nor to guide or monitor treatment for MRSA infections. Performed at [facility], [place] [address], [place], [place] [address]      Labs: BNP (last 3 results) Recent Labs    01/11/20 1312  BNP  97.0   Basic Metabolic Panel: Recent Labs  Lab 01/13/20 0300 01/14/20 0425 01/15/20 0507 01/16/20 0607 01/17/20 0443  NA 129* 126* 132* 137 135  K 3.8 3.7 4.0 3.7 3.7  CL 97* 95* 99 103 100  CO2 24 25 26 26 25   GLUCOSE 125* 107* 159* 113* 102*  BUN 10 6* <5* 6* 6*  CREATININE 0.67 0.43* 0.40* 0.54 0.48  CALCIUM 7.3* 7.3* 7.8* 8.0* 7.9*  MG 2.0  --   --   --   --   PHOS 1.2*  --   --   --   --    Liver Function Tests: Recent Labs  Lab 01/13/20 0300 01/14/20 0425 01/15/20 0507 01/16/20 0607 01/17/20 0443  AST 472* 345* 295* 189* 165*  ALT 225* 178* 187* 152* 143*  ALKPHOS 519* 379* 382* 334* 282*  BILITOT 0.8 0.8 0.8 0.7 0.5   PROT 5.1* 4.8* 5.8* 5.8* 5.9*  ALBUMIN 2.5* 2.3* 2.6* 2.7* 2.7*   No results for input(s): LIPASE, AMYLASE in the last 168 hours. No results for input(s): AMMONIA in the last 168 hours. CBC: Recent Labs  Lab 01/13/20 0300 01/14/20 0425 01/15/20 0507 01/16/20 0607 01/17/20 0443  WBC 4.7 5.5 4.8 8.6 8.6  NEUTROABS 3.4  --   --   --   --   HGB 12.7 12.0 13.2 13.0 13.1  HCT 37.2 35.4* 37.9 38.1 38.9  MCV 92.5 91.7 90.7 92.5 92.8  PLT 94* 125* 145* 236 251   Cardiac Enzymes: No results for input(s): CKTOTAL, CKMB, CKMBINDEX, TROPONINI in the last 168 hours. BNP: Invalid input(s): POCBNP CBG: No results for input(s): GLUCAP in the last 168 hours. D-Dimer No results for input(s): DDIMER in the last 72 hours. Hgb A1c No results for input(s): HGBA1C in the last 72 hours. Lipid Profile No results for input(s): CHOL, HDL, LDLCALC, TRIG, CHOLHDL, LDLDIRECT in the last 72 hours. Thyroid function studies No results for input(s): TSH, T4TOTAL, T3FREE, THYROIDAB in the last 72 hours.  Invalid input(s): FREET3 Anemia work up No results for input(s): VITAMINB12, FOLATE, FERRITIN, TIBC, IRON, RETICCTPCT in the last 72 hours. Urinalysis    Component Value Date/Time   COLORURINE STRAW (A) 01/11/2020 1055   APPEARANCEUR CLEAR 01/11/2020 1055   LABSPEC 1.002 (L) 01/11/2020 1055   PHURINE 7.0 01/11/2020 1055   GLUCOSEU NEGATIVE 01/11/2020 1055   HGBUR NEGATIVE 01/11/2020 1055   BILIRUBINUR NEGATIVE 01/11/2020 1055   KETONESUR NEGATIVE 01/11/2020 1055   PROTEINUR NEGATIVE 01/11/2020 1055   NITRITE NEGATIVE 01/11/2020 1055   LEUKOCYTESUR NEGATIVE 01/11/2020 1055   Sepsis Labs Invalid input(s): PROCALCITONIN,  WBC,  LACTICIDVEN Microbiology Recent Results (from the past 240 hour(s))  SARS Coronavirus 2 by RT PCR (hospital order, performed in [facility] hospital lab) Nasopharyngeal Nasopharyngeal Swab     Status: None   Collection Time: 01/11/20 10:21 AM   Specimen: Nasopharyngeal  Swab  Result Value Ref Range Status   SARS Coronavirus 2 NEGATIVE NEGATIVE Final    Comment: (NOTE) SARS-CoV-2 target nucleic acids are NOT DETECTED. The SARS-CoV-2 RNA is generally detectable in upper and lower respiratory specimens during the acute phase of infection. The lowest concentration of SARS-CoV-2 viral copies this assay can detect is 250 copies / mL. A negative result does not preclude SARS-CoV-2 infection and should not be used as the sole basis for treatment or other patient management decisions.  A negative result may occur with improper specimen collection / handling, submission of specimen other than nasopharyngeal swab,  presence of viral mutation(s) within the areas targeted by this assay, and inadequate number of viral copies (<250 copies / mL). A negative result must be combined with clinical observations, patient history, and epidemiological information. Fact Sheet for Patients:   StrictlyIdeas.no Fact Sheet for Healthcare Providers: BankingDealers.co.za This test is not yet approved or cleared  by the [place] FDA and has been authorized for detection and/or diagnosis of SARS-CoV-2 by FDA under an Emergency Use Authorization (EUA).  This EUA will remain in effect (meaning this test can be used) for the duration of the COVID-19 declaration under Section 564(b)(1) of the Act, 21 U.S.C. section 360bbb-3(b)(1), unless the authorization is terminated or revoked sooner. Performed at [facility], [place] [address], [place], [place] [address]   Respiratory Panel by PCR     Status: None   Collection Time: 01/11/20 10:21 AM   Specimen: Nasopharyngeal Swab; Respiratory  Result Value Ref Range Status   Adenovirus NOT DETECTED NOT DETECTED Final   Coronavirus 229E NOT DETECTED NOT DETECTED Final    Comment: (NOTE) The Coronavirus on the Respiratory Panel, DOES NOT test for the novel  Coronavirus (2019 nCoV)     Coronavirus HKU1 NOT DETECTED NOT DETECTED Final   Coronavirus NL63 NOT DETECTED NOT DETECTED Final   Coronavirus OC43 NOT DETECTED NOT DETECTED Final   Metapneumovirus NOT DETECTED NOT DETECTED Final   Rhinovirus / Enterovirus NOT DETECTED NOT DETECTED Final   Influenza A NOT DETECTED NOT DETECTED Final   Influenza B NOT DETECTED NOT DETECTED Final   Parainfluenza Virus 1 NOT DETECTED NOT DETECTED Final   Parainfluenza Virus 2 NOT DETECTED NOT DETECTED Final   Parainfluenza Virus 3 NOT DETECTED NOT DETECTED Final   Parainfluenza Virus 4 NOT DETECTED NOT DETECTED Final   Respiratory Syncytial Virus NOT DETECTED NOT DETECTED Final   Bordetella pertussis NOT DETECTED NOT DETECTED Final   Chlamydophila pneumoniae NOT DETECTED NOT DETECTED Final   Mycoplasma pneumoniae NOT DETECTED NOT DETECTED Final    Comment: Performed at [facility], [place]. [address], [place], [place] [address]  Resp Panel by RT PCR (RSV, Flu A&B, Covid) - Nasopharyngeal Swab     Status: None   Collection Time: 01/11/20  4:34 PM   Specimen: Nasopharyngeal Swab  Result Value Ref Range Status   SARS Coronavirus 2 by RT PCR NEGATIVE NEGATIVE Final    Comment: (NOTE) SARS-CoV-2 target nucleic acids are NOT DETECTED. The SARS-CoV-2 RNA is generally detectable in upper respiratoy specimens during the acute phase of infection. The lowest concentration of SARS-CoV-2 viral copies this assay can detect is 131 copies/mL. A negative result does not preclude SARS-Cov-2 infection and should not be used as the sole basis for treatment or other patient management decisions. A negative result may occur with  improper specimen collection/handling, submission of specimen other than nasopharyngeal swab, presence of viral mutation(s) within the areas targeted by this assay, and inadequate number of viral copies (<131 copies/mL). A negative result must be combined with clinical observations, patient history, and epidemiological  information. The expected result is Negative. Fact Sheet for Patients:  PinkCheek.be Fact Sheet for Healthcare Providers:  GravelBags.it This test is not yet ap proved or cleared by the [place] FDA and  has been authorized for detection and/or diagnosis of SARS-CoV-2 by FDA under an Emergency Use Authorization (EUA). This EUA will remain  in effect (meaning this test can be used) for the duration of the COVID-19 declaration under Section 564(b)(1) of  the Act, 21 U.S.C. section 360bbb-3(b)(1), unless the authorization is terminated or revoked sooner.    Influenza A by PCR NEGATIVE NEGATIVE Final   Influenza B by PCR NEGATIVE NEGATIVE Final    Comment: (NOTE) The Xpert Xpress SARS-CoV-2/FLU/RSV assay is intended as an aid in  the diagnosis of influenza from Nasopharyngeal swab specimens and  should not be used as a sole basis for treatment. Nasal washings and  aspirates are unacceptable for Xpert Xpress SARS-CoV-2/FLU/RSV  testing. Fact Sheet for Patients: PinkCheek.be Fact Sheet for Healthcare Providers: GravelBags.it This test is not yet approved or cleared by the [place] FDA and  has been authorized for detection and/or diagnosis of SARS-CoV-2 by  FDA under an Emergency Use Authorization (EUA). This EUA will remain  in effect (meaning this test can be used) for the duration of the  Covid-19 declaration under Section 564(b)(1) of the Act, 21  U.S.C. section 360bbb-3(b)(1), unless the authorization is  terminated or revoked.    Respiratory Syncytial Virus by PCR NEGATIVE NEGATIVE Final    Comment: (NOTE) Fact Sheet for Patients: PinkCheek.be Fact Sheet for Healthcare Providers: GravelBags.it This test is not yet approved or cleared by the [place] FDA and  has been authorized for detection  and/or diagnosis of SARS-CoV-2 by  FDA under an Emergency Use Authorization (EUA). This EUA will remain  in effect (meaning this test can be used) for the duration of the  COVID-19 declaration under Section 564(b)(1) of the Act, 21 U.S.C.  section 360bbb-3(b)(1), unless the authorization is terminated or  revoked. Performed at [facility], [place] [address], [place], [place] [address]   MRSA PCR Screening     Status: None   Collection Time: 01/12/20 10:42 AM   Specimen: Nasal Mucosa; Nasopharyngeal  Result Value Ref Range Status   MRSA by PCR NEGATIVE NEGATIVE Final    Comment:        The GeneXpert MRSA Assay (FDA approved for NASAL specimens only), is one component of a comprehensive MRSA colonization surveillance program. It is not intended to diagnose MRSA infection nor to guide or monitor treatment for MRSA infections. Performed at [facility], [place] [address] [place], [place] [address]      Time coordinating discharge: 25 minutes The [place] controlled substances registry was reviewed for this patient .      SIGNED:   Edwin Dada, MD  [facility] 01/18/2020, 4:41 PM

## 2020-01-18 NOTE — Progress Notes (Signed)
New order to discharge patient home.  PIV and cardiac monitoring discontinued.  AVS printed, reviewed with patient and family, and given to patient.  All questions answered.  Patient aware prescription has been called in to [facility].  Staff to transport patient to main entrance.  Family to transport patient home.  Patient in agreement with discharge and is eager to get home.

## 2020-01-19 ENCOUNTER — Telehealth: Payer: Self-pay | Admitting: Cardiology

## 2020-01-19 DIAGNOSIS — I471 Supraventricular tachycardia: Secondary | ICD-10-CM

## 2020-01-19 NOTE — Telephone Encounter (Signed)
ZIO PATCH  ORDER ENTERED AND APPT MADE FOR 02/29/20 AT 12:40 PM WITH DR Radford Pax .[employer]on[facility] Housekeeper

## 2020-01-19 NOTE — Addendum Note (Signed)
Addended by: Devra Dopp E on: 01/19/2020 03:56 PM   Modules accepted: Orders

## 2020-01-19 NOTE — Telephone Encounter (Signed)
Order entered for Zio patch and discharge summary states return in 4- 6 weeks for visit./ Lm for husband to call back ./cy

## 2020-01-19 NOTE — Telephone Encounter (Signed)
New message  Patient's husband is calling in to schedule a hospital follow up visit for the patient. Per discharge instructions, it says to follow up with Dr. Radford Pax in a month, but patient's husband states that he was under the assumption that the appointment was supposed to be a lot sooner than that. Also states that the patient was supposed to be receiving a monitor to wear, but there is no order in the chart for a monitor. Please confirm when the follow up visit should be and please assist with an order for the monitor. Call patient's husband back to advise.

## 2020-01-19 NOTE — Telephone Encounter (Signed)
Follow up  Pt's husband returning call

## 2020-01-21 ENCOUNTER — Ambulatory Visit: Payer: Medicare Other

## 2020-01-22 DIAGNOSIS — J90 Pleural effusion, not elsewhere classified: Secondary | ICD-10-CM

## 2020-01-26 ENCOUNTER — Other Ambulatory Visit: Payer: Self-pay | Admitting: Family Medicine

## 2020-01-26 DIAGNOSIS — Z1231 Encounter for screening mammogram for malignant neoplasm of breast: Secondary | ICD-10-CM

## 2020-01-27 ENCOUNTER — Ambulatory Visit
Admission: RE | Admit: 2020-01-27 | Discharge: 2020-01-27 | Disposition: A | Discharge status: Home / Self Care | Payer: Medicare Other | Source: Ambulatory Visit

## 2020-01-27 ENCOUNTER — Other Ambulatory Visit: Payer: Self-pay

## 2020-01-27 DIAGNOSIS — Z1231 Encounter for screening mammogram for malignant neoplasm of breast: Secondary | ICD-10-CM

## 2020-01-28 ENCOUNTER — Other Ambulatory Visit: Payer: Self-pay | Admitting: Family Medicine

## 2020-01-28 DIAGNOSIS — K7689 Other specified diseases of liver: Secondary | ICD-10-CM

## 2020-02-01 ENCOUNTER — Ambulatory Visit: Payer: Medicare Other | Admitting: Adult Health

## 2020-02-03 ENCOUNTER — Other Ambulatory Visit: Payer: Self-pay

## 2020-02-03 ENCOUNTER — Ambulatory Visit (INDEPENDENT_AMBULATORY_CARE_PROVIDER_SITE_OTHER): Payer: Medicare Other | Admitting: Pulmonary Disease

## 2020-02-03 ENCOUNTER — Encounter: Payer: Self-pay | Admitting: Pulmonary Disease

## 2020-02-03 VITALS — BP 120/68 | HR 72 | Temp 98.8°F | Ht 61.5 in | Wt 105.4 lb

## 2020-02-03 DIAGNOSIS — R918 Other nonspecific abnormal finding of lung field: Secondary | ICD-10-CM | POA: Diagnosis not present

## 2020-02-03 DIAGNOSIS — W57XXXS Bitten or stung by nonvenomous insect and other nonvenomous arthropods, sequela: Secondary | ICD-10-CM

## 2020-02-03 DIAGNOSIS — W57XXXA Bitten or stung by nonvenomous insect and other nonvenomous arthropods, initial encounter: Secondary | ICD-10-CM | POA: Insufficient documentation

## 2020-02-03 NOTE — Assessment & Plan Note (Signed)
Plan: Repeat chest x-ray next month as managed by [facility] physician group Results to be forwarded to our office Can tentatively plan on repeating CT chest in 3 months

## 2020-02-03 NOTE — Assessment & Plan Note (Addendum)
Plan: We will repeat chest x-ray in July/2021, [facility] to forward to our office Can tentatively plan on repeat CT imaging in 3 months based off of chest x-ray results Notify our office if you begin clinically worsening

## 2020-02-03 NOTE — Progress Notes (Signed)
@Patient  ID: Kristina Myers, female    DOB: 12-09-1945, 74 y.o.   MRN: [id number]  Chief Complaint  Patient presents with   Hospitalization Follow-up    pneumonia, dry cough at times, DOE only with exercise    Referring provider: Kelton Pillar, MD  HPI:  74 year old never smoker initially consulted with our practice on 01/11/2020 for hospitalization status post a tick bite.  Patient had acute respiratory failure and groundglass opacity on the CT at that time.  PMH: Tick bite Smoker/ Smoking History: Never smoker Maintenance: None Pt of: Needs outpatient pulmonary  02/03/2020  - Visit   74 year old female never smoker initially consulted with our practice by Dr. Elsworth Soho on 01/11/2020 during hospitalization.  Patient was admitted on 01/11/2020 which is same date as the consult date.  With atypical pneumonia, hypoxemia as well as hyponatremia Covid testing was negative.  She is noted to have saturation of 90% on room air with chest x-ray showing patchy bilateral infiltrates.  Patient was consulted with infectious disease.  Treated with doxycycline.  An excerpt of patient's discharge summary is listed below:  Human monocytic ehrlichiosis Patient recently spent a few weeks at her vacation home at [facility] in [place]. During that time she was gardening, found several ticks on her.  Subsequently developed fever, malaise,which progressed torespiratory distress. Presented with leukopenia, hyponatremia, transaminitis, and thrombocytopenia. 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   Started on IV doxycycline, and had good clinical response.  Weaned from 15 L oxygen to room air.  Platelets, WBC normalized.  Discharged to complete 10 days of doxycycline.   Acute respiratory failure with hypoxia due to ARDS from ehrlichiosis Patient's respiratory failure was characterized by bilateral infiltrates, severe hypoxia, and respiratory distress.  This appears to be  pulmonary involvement of HME.  Congestive heart failure was ruled out, she was treated for the underlying ehrlichiosis with doxycycline, and her respiratory involvement completely resolved.  On the day of discharge she was ambulatory without hypoxia or dyspnea.  Elevated troponin Demand ishcemia, no further ischemic work up necessary, ACS ruled out. Peaked at 900.  Hyponatremia Resolved  SVT Patient had several episodes of brief nonsustained tachycardia.  She was evaluated by cardiology, Dr. Radford Pax, who felt this is likely to be related to hypoxia, self-limited, and did not recommend nodal blocking agents.  She recommended outpatient follow-up, 2-week Zio patch, and reevaluation after cardiac monitoring.    Liver cyst Incidental finding, unclear clinical significance.  Repeat US liver in 6 months  Patient presenting to office today.  She reports that she is clinically improved since being discharged from the hospital.  She reports that she is getting back to baseline.  She is able to exercise and walk for 30 minutes at a time.  Without dyspnea.  Overall she feels that she is doing well.  She denies any history of asthma or respiratory illness.  She denies any seasonal allergies.  She is a never smoker.  Patient would like to review the connective tissue labs that were ordered in the hospital.  She also would like to review when her next follow-up CT chest would be.  She reports that when she was in the hospital she was informed that she would need a follow-up CT chest to ensure that she would not have permanent scarring.   Questionaires / Pulmonary Flowsheets:   ACT:  No flowsheet data found.  MMRC: No flowsheet data found.  Epworth:  No flowsheet data found.  Tests:  01/11/2019 -CTA chest-no evidence of PE, upper lobe predominant smooth interlobular septal thickening with patchy and confluent peribronchial vascular groundglass densities concerning for pulmonary edema less likely  atypical infection, small to moderate bilateral pleural effusions, small perihepatic ascites  01/15/2020-chest x-ray-multilobular bilateral pneumonia, more confluent consolidation atelectasis in the right upper lobe seen on prior study, small bilateral pleural effusions have decreased slightly, aortic arthrosclerosis, mild cardiomegaly  01/11/2020-echocardiogram-LV ejection fraction 60 to 66%, grade 1 diastolic dysfunction, RV ventricular systolic function normal  01/14/2020-sed rate-10 01/14/2020-rheumatoid factor-negative 01/14/2020-ANA with reflex test negative  FENO:  No results found for: NITRICOXIDE  PFT: No flowsheet data found.  WALK:  No flowsheet data found.  Imaging: DG Chest 1 View  Result Date: 01/12/2020 CLINICAL DATA:  Pneumonia, increasing work of breathing EXAM: CHEST  1 VIEW COMPARISON:  CT and radiograph 01/11/2020 FINDINGS: Increasing airspace opacity in the mid to upper lungs and right lung base. Bilateral pleural effusions are again seen. Background of coarse reticular interstitial opacity throughout both lungs. Cardiomediastinal contours are stable. The aorta is calcified. The remaining cardiomediastinal contours are unremarkable. No acute osseous or soft tissue abnormality. Degenerative changes are present in the imaged spine and shoulders. Telemetry leads overlie the chest. IMPRESSION: Increasing airspace opacity in the mid to upper lungs and right lung base which could reflect edema and/or infection. Similar distribution to the recent CT. Bilateral effusions. Aortic Atherosclerosis (ICD10-I70.0). Electronically Signed   By: Lovena Le M.D.   On: 01/12/2020 06:00   DG Chest 2 View  Result Date: 01/11/2020 CLINICAL DATA:  Cough, nausea, headache, recent shingles vaccine. EXAM: CHEST - 2 VIEW COMPARISON:  09/14/2013 chest radiograph. FINDINGS: Stable cardiomediastinal silhouette with normal heart size. No pneumothorax. No pleural effusion. New diffuse streaky parahilar  opacities throughout both lungs. IMPRESSION: New diffuse streaky parahilar opacities throughout both lungs, which could represent atypical pneumonia or pulmonary edema. Recommend follow-up chest radiographs. Electronically Signed   By: Ilona Sorrel M.D.   On: 01/11/2020 08:45   CT Angio Chest PE W and/or Wo Contrast  Result Date: 01/11/2020 CLINICAL DATA:  Cough, chills, and vomiting. EXAM: CT ANGIOGRAPHY CHEST WITH CONTRAST TECHNIQUE: Multidetector CT imaging of the chest was performed using the standard protocol during bolus administration of intravenous contrast. Multiplanar CT image reconstructions and MIPs were obtained to evaluate the vascular anatomy. CONTRAST:  57mL OMNIPAQUE IOHEXOL 350 MG/ML SOLN COMPARISON:  Chest x-ray from same day. FINDINGS: Cardiovascular: Satisfactory opacification of the pulmonary arteries to the segmental level. No evidence of pulmonary embolism. Normal heart size. No pericardial effusion. No thoracic aortic aneurysm or dissection. Mediastinum/Nodes: No enlarged mediastinal, hilar, or axillary lymph nodes. Tiny subcentimeter hypodense nodules and calcifications in the thyroid gland. Not clinically significant; no follow-up imaging recommended. Trachea and esophagus demonstrate no significant findings. Lungs/Pleura: Upper lobe predominant smooth interlobular septal thickening. Upper lobe predominant patchy and confluent peribronchovascular ground-glass densities. Dependent atelectasis in both lower lobes. Small to moderate bilateral pleural effusions. No pneumothorax. Upper Abdomen: Small perihepatic ascites. Air anterior to the liver within largely decompressed transverse colon. Musculoskeletal: No chest wall abnormality. No acute or significant osseous findings. Review of the MIP images confirms the above findings. IMPRESSION: 1. No evidence of pulmonary embolism. 2. Upper lobe predominant smooth interlobular septal thickening with patchy and confluent peribronchovascular  ground-glass densities, concerning for pulmonary edema, less likely atypical infection. 3. Small to moderate bilateral pleural effusions. 4. Small perihepatic ascites. Electronically Signed   By: Titus Dubin M.D.   On: 01/11/2020 15:18  DG CHEST PORT 1 VIEW  Result Date: 01/15/2020 CLINICAL DATA:  74 year old female with history of pulmonary infiltrates. EXAM: PORTABLE CHEST 1 VIEW COMPARISON:  Chest x-ray 01/14/2020. FINDINGS: Extensive multifocal airspace consolidation, most evident in the upper lobes of the lungs bilaterally (right greater than left) where there is also widespread air bronchograms. Elevation of the minor fissure indicative of worsening right upper lobe volume loss. Medial basilar opacities bilaterally may reflect additional areas of atelectasis and/or consolidation. Small bilateral pleural effusions. No evidence of pulmonary edema. Heart size is mildly enlarged. Aortic atherosclerosis. IMPRESSION: 1. Multilobar bilateral pneumonia, with more confluent consolidation and atelectasis in the right upper lobe than seen on the prior study. 2. Small bilateral pleural effusions have decreased slightly. 3. Aortic atherosclerosis. 4. Mild cardiomegaly. Electronically Signed   By: Vinnie Langton M.D.   On: 01/15/2020 08:57   DG CHEST PORT 1 VIEW  Result Date: 01/14/2020 CLINICAL DATA:  Acute respiratory failure.  Hypoxia. EXAM: PORTABLE CHEST 1 VIEW COMPARISON:  Single-view of the chest 01/13/2020 and 01/12/2020. PA and lateral chest 01/11/2020. CT chest 01/11/2020. FINDINGS: Extensive bilateral airspace disease persists. Aeration in the right upper and lower lung zones has improved but aeration in the left lung base has worsened. Small bilateral pleural effusions are seen. No pneumothorax. Heart size is normal. IMPRESSION: Multifocal pneumonia. There has been some improved aeration in the right chest but airspace disease in the left lung base is worse than on yesterday's exam. Electronically  Signed   By: Inge Rise M.D.   On: 01/14/2020 09:25   DG Chest Port 1 View  Result Date: 01/13/2020 CLINICAL DATA:  Respiratory distress EXAM: PORTABLE CHEST 1 VIEW COMPARISON:  January 12, 2020 FINDINGS: There is progression of consolidation in the right upper lobe compared to 1 day prior. There is stable airspace opacity throughout much of the left upper lobe as well as in the right base. There are pleural effusions bilaterally, similar to 1 day prior. Heart size normal. No adenopathy evident. Pulmonary vascularity within normal limits. No bone lesions. IMPRESSION: Multifocal airspace opacity with increase in consolidation in the right upper lobe. Other areas of airspace opacity appear stable compared to 1 day prior. Small pleural effusions bilaterally. Stable cardiac silhouette. Electronically Signed   By: Lowella Grip III M.D.   On: 01/13/2020 09:28   MM 3D SCREEN BREAST BILATERAL  Result Date: 01/29/2020 CLINICAL DATA:  Screening. EXAM: DIGITAL SCREENING BILATERAL MAMMOGRAM WITH TOMO AND CAD COMPARISON:  Previous exam(s). ACR Breast Density Category b: There are scattered areas of fibroglandular density. FINDINGS: There are no findings suspicious for malignancy. Images were processed with CAD. IMPRESSION: No mammographic evidence of malignancy. A result letter of this screening mammogram will be mailed directly to the patient. RECOMMENDATION: Screening mammogram in one year. (Code:[id number]-B-01Y) BI-RADS CATEGORY  1: Negative. Electronically Signed   By: Kristopher Oppenheim M.D.   On: 01/29/2020 16:21   ECHOCARDIOGRAM COMPLETE  Result Date: 01/11/2020    ECHOCARDIOGRAM REPORT   Patient Name:   Kristina Myers Date of Exam: 01/11/2020 Medical Rec #:  [id number]                 Height:       62.0 in Accession #:    [id number]                Weight:       107.0 lb Date of Birth:  02/24/1946  BSA:          1.465 m Patient Age:    40 years                  BP:           105/70 mmHg  Patient Gender: F                         HR:           96 bpm. Exam Location:  Inpatient Procedure: 2D Echo Indications:    Dyspnea 786.09 / R06.00  History:        Patient has no prior history of Echocardiogram examinations.                 Risk Factors:Non-Smoker. Pneumonia.  Sonographer:    Leavy Cella Referring Phys: [id number] Alvin  1. Left ventricular ejection fraction, by estimation, is 60 to 65%. The left ventricle has normal function. The left ventricle has no regional wall motion abnormalities. Left ventricular diastolic parameters are consistent with Grade I diastolic dysfunction (impaired relaxation).  2. Right ventricular systolic function is normal. The right ventricular size is normal.  3. The mitral valve is grossly normal. Trivial mitral valve regurgitation.  4. The aortic valve is tricuspid. Aortic valve regurgitation is not visualized.  5. The inferior vena cava is dilated in size with >50% respiratory variability, suggesting right atrial pressure of 8 mmHg. FINDINGS  Left Ventricle: Left ventricular ejection fraction, by estimation, is 60 to 65%. The left ventricle has normal function. The left ventricle has no regional wall motion abnormalities. The left ventricular internal cavity size was normal in size. There is  no left ventricular hypertrophy. Left ventricular diastolic parameters are consistent with Grade I diastolic dysfunction (impaired relaxation). Indeterminate filling pressures. Right Ventricle: The right ventricular size is normal. No increase in right ventricular wall thickness. Right ventricular systolic function is normal. Left Atrium: Left atrial size was normal in size. Right Atrium: Right atrial size was normal in size. Pericardium: There is no evidence of pericardial effusion. Mitral Valve: The mitral valve is grossly normal. Trivial mitral valve regurgitation. Tricuspid Valve: The tricuspid valve is grossly normal. Tricuspid valve regurgitation is  trivial. Aortic Valve: The aortic valve is tricuspid. Aortic valve regurgitation is not visualized. Pulmonic Valve: The pulmonic valve was normal in structure. Pulmonic valve regurgitation is not visualized. Aorta: The aortic root and ascending aorta are structurally normal, with no evidence of dilitation. Venous: The inferior vena cava is dilated in size with greater than 50% respiratory variability, suggesting right atrial pressure of 8 mmHg. IAS/Shunts: No atrial level shunt detected by color flow Doppler. Additional Comments: There is a small pleural effusion in the right lateral region. Mild ascites is present.  LEFT VENTRICLE PLAX 2D LVIDd:         4.80 cm  Diastology LVIDs:         3.40 cm  LV e' lateral:   8.05 cm/s LV PW:         0.80 cm  LV E/e' lateral: 10.2 LV IVS:        0.90 cm  LV e' medial:    8.16 cm/s LVOT diam:     1.90 cm  LV E/e' medial:  10.0 LVOT Area:     2.84 cm  RIGHT VENTRICLE RV S prime:     21.30 cm/s TAPSE (M-mode): 2.2 cm LEFT ATRIUM  Index LA diam:      2.40 cm 1.64 cm/m LA Vol (A2C): 49.8 ml 33.98 ml/m LA Vol (A4C): 39.2 ml 26.75 ml/m   AORTA Ao Root diam: 3.00 cm MITRAL VALVE MV Area (PHT): 5.27 cm    SHUNTS MV Decel Time: 144 msec    Systemic Diam: 1.90 cm MV E velocity: 82.00 cm/s MV A velocity: 42.70 cm/s MV E/A ratio:  1.92 Lyman Bishop MD Electronically signed by Lyman Bishop MD Signature Date/Time: 01/11/2020/4:49:56 PM    Final    US Abdomen Limited RUQ  Result Date: 01/11/2020 CLINICAL DATA:  Nausea and right upper quadrant pain since Thursday. EXAM: ULTRASOUND ABDOMEN LIMITED RIGHT UPPER QUADRANT COMPARISON:  None. FINDINGS: Gallbladder: Sludge. No gallstones. Asymmetric wall thickening and edema measuring up to 9 mm. No sonographic Murphy sign noted by sonographer. Common bile duct: Diameter: 5 mm, normal. Liver: 7 x 9 x 8 mm echogenic lesion in the right. Within normal limits in parenchymal echogenicity. Portal vein is patent on color Doppler imaging with  normal direction of blood flow towards the liver. Other: Small perihepatic ascites.  Small right pleural effusion. IMPRESSION: 1. Gallbladder sludge with asymmetric wall thickening and edema. Given small perihepatic ascites, small right pleural effusion, and elevated LFTs, the gallbladder appearance is favored related to underlying liver disease, although acalculus cholecystitis is not entirely excluded. 2. Small 0.9 cm hyperechoic lesion in the right lobe of the liver, probably a hemangioma, benign. If definitive characterization is required, consider follow-up MRI abdomen without and with MultiHance in 6 months. Electronically Signed   By: Titus Dubin M.D.   On: 01/11/2020 12:42    Lab Results:  CBC    Component Value Date/Time   WBC 8.6 01/17/2020 0443   RBC 4.19 01/17/2020 0443   HGB 13.1 01/17/2020 0443   HCT 38.9 01/17/2020 0443   PLT 251 01/17/2020 0443   MCV 92.8 01/17/2020 0443   MCH 31.3 01/17/2020 0443   MCHC 33.7 01/17/2020 0443   RDW 12.8 01/17/2020 0443   LYMPHSABS 0.9 01/13/2020 0300   MONOABS 0.3 01/13/2020 0300   EOSABS 0.0 01/13/2020 0300   BASOSABS 0.0 01/13/2020 0300    BMET    Component Value Date/Time   NA 135 01/17/2020 0443   K 3.7 01/17/2020 0443   CL 100 01/17/2020 0443   CO2 25 01/17/2020 0443   GLUCOSE 102 (H) 01/17/2020 0443   BUN 6 (L) 01/17/2020 0443   CREATININE 0.48 01/17/2020 0443   CALCIUM 7.9 (L) 01/17/2020 0443   GFRNONAA >60 01/17/2020 0443   GFRAA >60 01/17/2020 0443    BNP    Component Value Date/Time   BNP 66.5 01/11/2020 1312    ProBNP No results found for: PROBNP  Specialty Problems      Pulmonary Problems   Community acquired pneumonia   Hypoxia   Pneumonia   Pleural effusion      Allergies  Allergen Reactions   Penicillins     Immunization History  Administered Date(s) Administered   Influenza, High Dose Seasonal PF 05/27/2018   Moderna SARS-COVID-2 Vaccination 09/16/2019, 10/16/2019    Past  Medical History:  Diagnosis Date   Arthritis     Tobacco History: Social History   Tobacco Use  Smoking Status Never Smoker  Smokeless Tobacco Never Used   Counseling given: Not Answered   Continue to not smoke  Outpatient Encounter Medications as of 02/03/2020  Medication Sig   CALCIUM PO Take 1 tablet by mouth daily.  Multiple Vitamin (MULTIVITAMIN ADULT PO) Take 1 tablet by mouth daily.   TURMERIC PO Take 1 tablet by mouth daily.   doxycycline (VIBRA-TABS) 100 MG tablet Take 1 tablet (100 mg total) by mouth every 12 (twelve) hours. (Patient not taking: Reported on 02/03/2020)   ondansetron (ZOFRAN-ODT) 4 MG disintegrating tablet Take 4 mg by mouth every 8 (eight) hours as needed for nausea or vomiting.  (Patient not taking: Reported on 02/03/2020)   No facility-administered encounter medications on file as of 02/03/2020.     Review of Systems  Review of Systems  Constitutional: Negative for activity change, fatigue and fever.  HENT: Negative for sinus pressure, sinus pain and sore throat.   Respiratory: Positive for cough (dry and improved). Negative for shortness of breath and wheezing.   Cardiovascular: Negative for chest pain and palpitations.  Gastrointestinal: Negative for diarrhea, nausea and vomiting.  Musculoskeletal: Negative for arthralgias.  Neurological: Negative for dizziness.  Psychiatric/Behavioral: Negative for sleep disturbance. The patient is not nervous/anxious.      Physical Exam  BP 120/68 (BP Location: Left Arm, Cuff Size: Normal)    Pulse 72    Temp 98.8 F (37.1 C) (Oral)    Ht 5' 1.5" (1.562 m)    Wt 105 lb 6.4 oz (47.8 kg)    SpO2 97%    BMI 19.59 kg/m   Wt Readings from Last 5 Encounters:  02/03/20 105 lb 6.4 oz (47.8 kg)  01/11/20 107 lb (48.5 kg)  11/07/17 109 lb (49.4 kg)  05/29/17 108 lb 6.4 oz (49.2 kg)  08/18/14 110 lb (49.9 kg)    BMI Readings from Last 5 Encounters:  02/03/20 19.59 kg/m  01/11/20 19.57 kg/m    11/07/17 19.94 kg/m  05/29/17 20.48 kg/m  08/18/14 20.12 kg/m     Physical Exam Vitals and nursing note reviewed.  Constitutional:      General: She is not in acute distress.    Appearance: Normal appearance. She is normal weight.  HENT:     Head: Normocephalic and atraumatic.     Right Ear: Tympanic membrane, ear canal and external ear normal. There is no impacted cerumen.     Left Ear: Tympanic membrane, ear canal and external ear normal. There is no impacted cerumen.     Nose: Nose normal. No congestion.     Mouth/Throat:     Mouth: Mucous membranes are moist.     Pharynx: Oropharynx is clear.  Eyes:     Pupils: Pupils are equal, round, and reactive to light.  Cardiovascular:     Rate and Rhythm: Normal rate and regular rhythm.     Pulses: Normal pulses.     Heart sounds: Normal heart sounds. No murmur heard.   Pulmonary:     Breath sounds: Normal breath sounds. No decreased air movement. No decreased breath sounds, wheezing or rales.  Musculoskeletal:     Cervical back: Normal range of motion.  Skin:    General: Skin is warm and dry.     Capillary Refill: Capillary refill takes less than 2 seconds.  Neurological:     General: No focal deficit present.     Mental Status: She is alert and oriented to person, place, and time. Mental status is at baseline.     Gait: Gait normal.  Psychiatric:        Mood and Affect: Mood normal.        Behavior: Behavior normal.        Thought Content: Thought content  normal.        Judgment: Judgment normal.       Assessment & Plan:   Tick bite Plan: We will repeat chest x-ray in July/2021, [facility] to forward to our office Can tentatively plan on repeat CT imaging in 3 months based off of chest x-ray results  Abnormal findings on diagnostic imaging of lung Plan: Repeat chest x-ray next month as managed by [facility] physician group Results to be forwarded to our office Can tentatively plan on repeating CT chest in 3  months    Return in about 3 months (around 05/05/2020), or if symptoms worsen or fail to improve, for Follow up with Dr. Elsworth Soho, Follow up with Dr. Ander Slade, [place].   Lauraine Rinne, NP 02/03/2020   This appointment required 32 minutes of patient care (this includes precharting, chart review, review of results, face-to-face care, etc.).

## 2020-02-03 NOTE — Patient Instructions (Addendum)
You were seen today by Lauraine Rinne, NP  for:   [facility] meeting you today in office.  I am glad you are feeling better since being discharged from the hospital.  Please discuss with your husband if you had a preference on the pulmonologist that you saw while hospitalized.  It looks like both Dr. Elsworth Soho and Dr. Ander Slade had seen you.  We will get you placed for set up for follow-up with one of them.  We will tentatively plan on repeating a CT of your chest in 3 months  Please notify our office if symptoms or not improving.  Take care and stay safe,   Nike Southwell  1. Abnormal findings on diagnostic imaging of lung  Complete chest x-ray as outlined by [facility] physician group  Please have Dr. Laurann Montana for the x-ray results to our office  We can tentatively plan on having a repeat CT chest in 3 months I have placed that order  2. Tick bite, sequela  Glad you are improving from the hospital  Please notify our office if your symptoms worsen   Follow Up:    Return in about 3 months (around 05/05/2020), or if symptoms worsen or fail to improve, for Follow up with Dr. Elsworth Soho, Follow up with Dr. Ander Slade, [place].  30-minute time slot with either Dr. Elsworth Soho or Dr. Ander Slade       Please do your part to reduce the spread of COVID-19:      Reduce your risk of any infection  and COVID19 by using the similar precautions used for avoiding the common cold or flu:  Marland Kitchen Wash your hands often with soap and warm water for at least 20 seconds.  If soap and water are not readily available, use an alcohol-based hand sanitizer with at least 60% alcohol.  . If coughing or sneezing, cover your mouth and nose by coughing or sneezing into the elbow areas of your shirt or coat, into a tissue or into your sleeve (not your hands). Langley Gauss A MASK when in public  . Avoid shaking hands with others and consider head nods or verbal greetings only. . Avoid touching your eyes, nose, or mouth with unwashed hands.  . Avoid  close contact with people who are sick. . Avoid places or events with large numbers of people in one location, like concerts or sporting events. . If you have some symptoms but not all symptoms, continue to monitor at home and seek medical attention if your symptoms worsen. . If you are having a medical emergency, call 911.   [place] / e-Visit: [email or web address]         [facility]: [facility]: [phone]                   [facility]: [phone]     It is flu season:   >>> Best ways to protect herself from the flu: Receive the yearly flu vaccine, practice good hand hygiene washing with soap and also using hand sanitizer when available, eat a nutritious meals, get adequate rest, hydrate appropriately   Please contact the office if your symptoms worsen or you have concerns that you are not improving.   Thank you for choosing [facility] for your healthcare, and for allowing [place] to partner with you on your healthcare journey. I am thankful to be able to provide care to you today.  Wyn Quaker FNP-C

## 2020-02-03 NOTE — Progress Notes (Signed)
Reviewed and agree with assessment/plan.   Chesley Mires, MD [facility] 02/03/2020, 7:12 PM Pager:  [phone]

## 2020-02-06 ENCOUNTER — Ambulatory Visit (INDEPENDENT_AMBULATORY_CARE_PROVIDER_SITE_OTHER): Payer: Medicare Other

## 2020-02-06 DIAGNOSIS — I471 Supraventricular tachycardia: Secondary | ICD-10-CM | POA: Diagnosis not present

## 2020-02-28 NOTE — Progress Notes (Signed)
Cardiology Office Note:    Date:  02/29/2020   ID:  Kristina Myers, Kristina Myers [birth year], MRN [id number]  PCP:  Kelton Pillar, MD  Cardiologist:  Fransico Him, MD    Referring MD: Kelton Pillar, MD   Chief Complaint  Patient presents with  . Follow-up    Erliichiosis with noncardiogenic pulmonary edema and elevated troponin    History of Present Illness:    Kristina Myers is a 74 y.o. female with no prior cardiac hx who recently was hospitalized with severe illness secondary to Erlichiosis tick-borne illness.  She presented with several days dry cough, chills, malaise, and headache.  Her symptoms progressed to shortness of breath and she came to the urgent care where she was found to be hypoxic and sent to the ER.  In the ER, chest x-ray showed bilateral infiltrates, WBC 2.1, platelets 60 5K, AST 908, and sodium 120.  He was hypoxic to the 70s and admitted for broad-spectrum antibiotics.  Patient recently spent a few weeks at her vacation home at [facility] in [place]. During that time she was gardening, found several ticks on her.  Subsequently developed fever, malaise,which progressed torespiratory distress. Presented with leukopenia, hyponatremia, transaminitis, and thrombocytopenia. 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 She developed severe pulmonary edema which was felt to be due to noncardiogenic pulmonary edema from Erlichiosis.   Started on IV doxycycline, and had good clinical response.  Weaned from 15 L oxygen to room air.  Platelets, WBC normalized.  Discharged to complete 10 days of doxycycline. hsTrop was elevated but 2D echo showed normal LVF and it was felt that she had demand ischemia from severe hypoxemia and pulmonary edema.  hsTrop peaked at 900.  She also had several episodes of brief nonsustained tachycardia.  This was also felt to likely to be related to hypoxia, A 2 week ziopatch was recommended to rule out silent  PAF and this has not been completed.  SHe is here today for followup and is doing well.  SHe denies any chest pain or pressure, SOB, DOE, PND, orthopnea, LE edema, dizziness, palpitations or syncope. She feels she is back to feeling normal again.  SHe is compliant with her meds and is tolerating meds with no SE.    Past Medical History:  Diagnosis Date  . Arthritis     Past Surgical History:  Procedure Laterality Date  . CESAREAN SECTION    . TUBAL LIGATION      Current Medications: Current Meds  Medication Sig  . CALCIUM PO Take 1 tablet by mouth daily.  . Multiple Vitamin (MULTIVITAMIN ADULT PO) Take 1 tablet by mouth daily.  . TURMERIC PO Take 1 tablet by mouth daily.     Allergies:   Penicillins   Social History   Socioeconomic History  . Marital status: Married    Spouse name: Not on file  . Number of children: Not on file  . Years of education: Not on file  . Highest education level: Not on file  Occupational History  . Not on file  Tobacco Use  . Smoking status: Never Smoker  . Smokeless tobacco: Never Used  Substance and Sexual Activity  . Alcohol use: Yes    Alcohol/week: 6.0 standard drinks    Types: 6 Standard drinks or equivalent per week  . Drug use: No  . Sexual activity: Not on file  Other Topics Concern  . Not on file  Social History Narrative  . Not on file  Social Determinants of Health   Financial Resource Strain:   . Difficulty of Paying Living Expenses:   Food Insecurity:   . Worried About Charity fundraiser in the Last Year:   . Arboriculturist in the Last Year:   Transportation Needs:   . Film/video editor (Medical):   Marland Kitchen Lack of Transportation (Non-Medical):   Physical Activity:   . Days of Exercise per Week:   . Minutes of Exercise per Session:   Stress:   . Feeling of Stress :   Social Connections:   . Frequency of Communication with Friends and Family:   . Frequency of Social Gatherings with Friends and Family:   .  Attends Religious Services:   . Active Member of Clubs or Organizations:   . Attends Archivist Meetings:   Marland Kitchen Marital Status:      Family History: The patient's family history includes Breast cancer in her mother; Cancer in her mother; Mental illness in her father and paternal grandmother.  ROS:   Please see the history of present illness.    ROS  All other systems reviewed and negative.   EKGs/Labs/Other Studies Reviewed:    The following studies were reviewed today: Hospital notes, 2D echo and labs  EKG:  EKG is not ordered today.    Recent Labs: 01/11/2020: B Natriuretic Peptide 66.5 01/12/2020: TSH 0.370 01/13/2020: Magnesium 2.0 01/17/2020: ALT 143; BUN 6; Creatinine, Ser 0.48; Hemoglobin 13.1; Platelets 251; Potassium 3.7; Sodium 135   Recent Lipid Panel    Component Value Date/Time   CHOL 120 01/12/2020 1011   TRIG 143 01/12/2020 1011   HDL 36 (L) 01/12/2020 1011   CHOLHDL 3.3 01/12/2020 1011   VLDL 29 01/12/2020 1011   LDLCALC 55 01/12/2020 1011    Physical Exam:    VS:  BP (!) 102/60   Pulse 88   Ht 5' 1.5" (1.562 m)   Wt 102 lb 9.6 oz (46.5 kg)   SpO2 96%   BMI 19.07 kg/m     Wt Readings from Last 3 Encounters:  02/29/20 102 lb 9.6 oz (46.5 kg)  02/03/20 105 lb 6.4 oz (47.8 kg)  01/11/20 107 lb (48.5 kg)     GEN:  Well nourished, well developed in no acute distress HEENT: Normal NECK: No JVD; No carotid bruits LYMPHATICS: No lymphadenopathy CARDIAC: RRR, no murmurs, rubs, gallops RESPIRATORY:  Clear to auscultation without rales, wheezing or rhonchi  ABDOMEN: Soft, non-tender, non-distended MUSCULOSKELETAL:  No edema; No deformity  SKIN: Warm and dry NEUROLOGIC:  Alert and oriented x 3 PSYCHIATRIC:  Normal affect   ASSESSMENT:    1. Elevated troponin   2. Noncardiogenic pulmonary edema   3. PAT (paroxysmal atrial tachycardia) ([place])   4. Abnormal electrocardiogram    PLAN:    In order of problems listed above:  1.  Elevated  troponin -hsTrop peaked at 900 but LVF normal on echo  -felt demand ischemia in the setting of acute hypoxic respiratory failure -I have recommended a Lexiscan myoview to rule out ischemia  2  Noncardiogenic pulmonary edema -resolved after treatment of tick-borne illness -PCP ordered repeat Cxray to reassess  3.  PAT -noted at time of severe tick-borne illness and felt related to hypoxia -she has worn a 2 week ziopatch to assess for silent atrial arrhythmias and this is still pending   Medication Adjustments/Labs and Tests Ordered: Current medicines are reviewed at length with the patient today.  Concerns regarding medicines are  outlined above.  Orders Placed This Encounter  Procedures  . MYOCARDIAL PERFUSION IMAGING   No orders of the defined types were placed in this encounter.   Signed, Fransico Him, MD  02/29/2020 1:26 PM    [place]

## 2020-02-29 ENCOUNTER — Encounter: Payer: Self-pay | Admitting: Cardiology

## 2020-02-29 ENCOUNTER — Telehealth: Payer: Self-pay | Admitting: Cardiology

## 2020-02-29 ENCOUNTER — Ambulatory Visit
Admission: RE | Admit: 2020-02-29 | Discharge: 2020-02-29 | Disposition: A | Discharge status: Home / Self Care | Payer: Medicare Other | Source: Ambulatory Visit | Attending: Family Medicine | Admitting: Family Medicine

## 2020-02-29 ENCOUNTER — Other Ambulatory Visit: Payer: Self-pay | Admitting: Family Medicine

## 2020-02-29 ENCOUNTER — Ambulatory Visit (INDEPENDENT_AMBULATORY_CARE_PROVIDER_SITE_OTHER): Payer: Medicare Other | Admitting: Cardiology

## 2020-02-29 ENCOUNTER — Other Ambulatory Visit: Payer: Medicare Other

## 2020-02-29 ENCOUNTER — Other Ambulatory Visit: Payer: Self-pay

## 2020-02-29 VITALS — BP 102/60 | HR 88 | Ht 61.5 in | Wt 102.6 lb

## 2020-02-29 DIAGNOSIS — K769 Liver disease, unspecified: Secondary | ICD-10-CM | POA: Diagnosis not present

## 2020-02-29 DIAGNOSIS — J811 Chronic pulmonary edema: Secondary | ICD-10-CM | POA: Diagnosis not present

## 2020-02-29 DIAGNOSIS — I471 Supraventricular tachycardia: Secondary | ICD-10-CM

## 2020-02-29 DIAGNOSIS — I4719 Other supraventricular tachycardia: Secondary | ICD-10-CM

## 2020-02-29 DIAGNOSIS — J80 Acute respiratory distress syndrome: Secondary | ICD-10-CM

## 2020-02-29 DIAGNOSIS — R945 Abnormal results of liver function studies: Secondary | ICD-10-CM | POA: Diagnosis not present

## 2020-02-29 DIAGNOSIS — R9431 Abnormal electrocardiogram [ECG] [EKG]: Secondary | ICD-10-CM | POA: Diagnosis not present

## 2020-02-29 DIAGNOSIS — R778 Other specified abnormalities of plasma proteins: Secondary | ICD-10-CM

## 2020-02-29 DIAGNOSIS — R918 Other nonspecific abnormal finding of lung field: Secondary | ICD-10-CM | POA: Diagnosis not present

## 2020-02-29 DIAGNOSIS — M419 Scoliosis, unspecified: Secondary | ICD-10-CM | POA: Diagnosis not present

## 2020-02-29 DIAGNOSIS — K7689 Other specified diseases of liver: Secondary | ICD-10-CM

## 2020-02-29 DIAGNOSIS — M47814 Spondylosis without myelopathy or radiculopathy, thoracic region: Secondary | ICD-10-CM | POA: Diagnosis not present

## 2020-02-29 DIAGNOSIS — R7989 Other specified abnormal findings of blood chemistry: Secondary | ICD-10-CM

## 2020-02-29 NOTE — Telephone Encounter (Signed)
Kristina Myers from [employer] is calling to report to Dr. Radford Pax and RN that this pts zio monitor results are available to be imported and reviewed.  This is a regular zio xt, not a live telemetry.  She is calling with the final report. Per [place] with zio, final interpretation monitor showed 2nd-degree type II heart block, one episode of 6.3 sec pause, and 25 episodes of SVT.  Sent a message to our monitor tech Shelly, to go ahead and import this pts zio monitor report to Dr. Theodosia Blender in-basket, to further interpret.  Will send this message to Dr. Radford Pax and her RN as a general FYI.

## 2020-02-29 NOTE — Telephone Encounter (Signed)
Dr. Theodosia Blender RN is aware of zio results imported and ready to read in Dr. Theodosia Blender in-basket.  Pt wore the monitor from the dates of 02/06/20 from 02/20/20.

## 2020-02-29 NOTE — Telephone Encounter (Signed)
Reference: [id number]

## 2020-02-29 NOTE — Patient Instructions (Signed)
Medication Instructions:  Your physician recommends that you continue on your current medications as directed. Please refer to the Current Medication list given to you today.  *If you need a refill on your cardiac medications before your next appointment, please call your pharmacy*  Testing/Procedures: Your physician has requested that you have a lexiscan myoview. For further information please visit [email or web address]. Please follow instruction sheet, as given.  Follow-Up: At [facility], you and your health needs are our priority.  As part of our continuing mission to provide you with exceptional heart care, we have created designated Provider Care Teams.  These Care Teams include your primary Cardiologist (physician) and Advanced Practice Providers (APPs -  Physician Assistants and Nurse Practitioners) who all work together to provide you with the care you need, when you need it.  Follow up with Dr. Turner as needed based on results of testing.    

## 2020-03-03 ENCOUNTER — Telehealth: Payer: Self-pay

## 2020-03-03 DIAGNOSIS — I471 Supraventricular tachycardia: Secondary | ICD-10-CM

## 2020-03-03 DIAGNOSIS — R9431 Abnormal electrocardiogram [ECG] [EKG]: Secondary | ICD-10-CM

## 2020-03-03 NOTE — Telephone Encounter (Signed)
-----   Message from Sueanne Margarita, MD sent at 03/02/2020 10:52 AM EDT ----- Patient is still having frequent and at times prolonged episodes of atrial tachycardia.  She also has had some heart block in the middle of the night with 6 second pauses in her heart beat.  I would like her referred to Dr. Curt Bears or Lovena Le with EP ASAP.

## 2020-03-03 NOTE — Telephone Encounter (Signed)
Left message for patient to call back. Referral placed for EP.

## 2020-03-04 ENCOUNTER — Telehealth: Payer: Self-pay | Admitting: Cardiology

## 2020-03-04 NOTE — Telephone Encounter (Signed)
Patient is returning phone can about EP referral. Please  call

## 2020-03-04 NOTE — Telephone Encounter (Signed)
Spoke with the patient regarding results from her heart monitor. Patient verbalized understanding. Advised her that she will be receiving a call to set up an appointment with EP.

## 2020-04-07 ENCOUNTER — Telehealth (HOSPITAL_COMMUNITY): Payer: Self-pay

## 2020-04-07 NOTE — Telephone Encounter (Signed)
Detailed instructions left on the patient's answering machine, asked to call back with any questions. S.Ileen Kahre EMTP

## 2020-04-08 ENCOUNTER — Telehealth: Payer: Self-pay | Admitting: Cardiology

## 2020-04-08 NOTE — Telephone Encounter (Signed)
     Pt said she have a cough for the last 4-5 days, she never had a fever but occasionally she excrete some phlegm, she wanted to know if she needs to r/s stress test because of it

## 2020-04-08 NOTE — Telephone Encounter (Signed)
Patient is returning call.  °

## 2020-04-08 NOTE — Telephone Encounter (Signed)
Left message for patient to call back  

## 2020-04-12 ENCOUNTER — Other Ambulatory Visit: Payer: Self-pay

## 2020-04-12 ENCOUNTER — Encounter (HOSPITAL_COMMUNITY): Payer: Medicare Other

## 2020-04-14 ENCOUNTER — Ambulatory Visit (INDEPENDENT_AMBULATORY_CARE_PROVIDER_SITE_OTHER): Payer: Medicare Other | Admitting: Cardiology

## 2020-04-14 ENCOUNTER — Other Ambulatory Visit: Payer: Self-pay

## 2020-04-14 ENCOUNTER — Encounter: Payer: Self-pay | Admitting: Cardiology

## 2020-04-14 VITALS — BP 118/72 | HR 76 | Ht 61.5 in | Wt 104.2 lb

## 2020-04-14 DIAGNOSIS — I471 Supraventricular tachycardia: Secondary | ICD-10-CM | POA: Diagnosis not present

## 2020-04-14 NOTE — Patient Instructions (Signed)
Medication Instructions:  Your physician recommends that you continue on your current medications as directed. Please refer to the Current Medication list given to you today.  *If you need a refill on your cardiac medications before your next appointment, please call your pharmacy*   Lab Work: None ordered If you have labs (blood work) drawn today and your tests are completely normal, you will receive your results only by: . MyChart Message (if you have MyChart) OR . A paper copy in the mail If you have any lab test that is abnormal or we need to change your treatment, we will call you to review the results.   Testing/Procedures: None ordered   Follow-Up: At [facility], you and your health needs are our priority.  As part of our continuing mission to provide you with exceptional heart care, we have created designated Provider Care Teams.  These Care Teams include your primary Cardiologist (physician) and Advanced Practice Providers (APPs -  Physician Assistants and Nurse Practitioners) who all work together to provide you with the care you need, when you need it.  We recommend signing up for the patient portal called "MyChart".  Sign up information is provided on this After Visit Summary.  MyChart is used to connect with patients for Virtual Visits (Telemedicine).  Patients are able to view lab/test results, encounter notes, upcoming appointments, etc.  Non-urgent messages can be sent to your provider as well.   To learn more about what you can do with MyChart, go to [email or web address].    Your next appointment:   6 month(s)  The format for your next appointment:   In Person  Provider:   Will Camnitz, MD   Thank you for choosing [facility]!!   Clemens Lachman, RN [phone]    Other Instructions    

## 2020-04-14 NOTE — Progress Notes (Signed)
Electrophysiology Office Note   Date:  04/14/2020   ID:  Kristina, Myers 1945/11/29, MRN [id number]  PCP:  Kelton Pillar, MD  Cardiologist:  Radford Pax Primary Electrophysiologist:  Carla Whilden Meredith Leeds, MD    Chief Complaint: SVT   History of Present Illness: Kristina Myers is a 74 y.o. female who is being seen today for the evaluation of SVT at the request of Turner, Eber Hong, MD. Presenting today for electrophysiology evaluation.  She has no cardiac past medical history.  She did have a recent hospitalization for a severe early [place] tickborne illness.  At that time, chest x-ray showed bilateral infiltrates with elevated platelets and an elevated AST and hyponatremia.  She became hypoxic and was found to be in pulmonary edema, noncardiac.  She was started on doxycycline with a good response.  Her high-sensitivity troponin was elevated, but her echo showed a normal ejection fraction.  This was thought due to demand ischemia from severe hypoxia.  She had several episodes of nonsustained tachycardia that was felt hypoxia related.  She wore a 2week that showed up to 2 hours of SVT.  Today, she denies symptoms of palpitations, chest pain, shortness of breath, orthopnea, PND, lower extremity edema, claudication, dizziness, presyncope, syncope, bleeding, or neurologic sequela. The patient is tolerating medications without difficulties.  Since her discharge she has done well.  She has no chest pain or shortness of breath.  She is able to do all of her daily activities.  She is asymptomatic from her SVT.  She has noted no palpitations.   Past Medical History:  Diagnosis Date  . Arthritis    Past Surgical History:  Procedure Laterality Date  . CESAREAN SECTION    . TUBAL LIGATION       Current Outpatient Medications  Medication Sig Dispense Refill  . CALCIUM PO Take 1 tablet by mouth daily.    . Multiple Vitamin (MULTIVITAMIN ADULT PO) Take 1 tablet by mouth daily.     . TURMERIC PO Take 1 tablet by mouth daily.     No current facility-administered medications for this visit.    Allergies:   Penicillins   Social History:  The patient  reports that she has never smoked. She has never used smokeless tobacco. She reports current alcohol use of about 6.0 standard drinks of alcohol per week. She reports that she does not use drugs.   Family History:  The patient's family history includes Breast cancer in her mother; Cancer in her mother; Mental illness in her father and paternal grandmother.    ROS:  Please see the history of present illness.   Otherwise, review of systems is positive for none.   All other systems are reviewed and negative.    PHYSICAL EXAM: VS:  BP 118/72   Pulse 76   Ht 5' 1.5" (1.562 m)   Wt 104 lb 3.2 oz (47.3 kg)   SpO2 95%   BMI 19.37 kg/m  , BMI Body mass index is 19.37 kg/m. GEN: Well nourished, well developed, in no acute distress  HEENT: normal  Neck: no JVD, carotid bruits, or masses Cardiac: RRR; no murmurs, rubs, or gallops,no edema  Respiratory:  clear to auscultation bilaterally, normal work of breathing GI: soft, nontender, nondistended, + BS MS: no deformity or atrophy  Skin: warm and dry Neuro:  Strength and sensation are intact Psych: euthymic mood, full affect  EKG:  EKG is ordered today. Personal review of the ekg ordered shows sinus rhythm, rate  76  Recent Labs: 01/11/2020: B Natriuretic Peptide 66.5 01/12/2020: TSH 0.370 01/13/2020: Magnesium 2.0 01/17/2020: ALT 143; BUN 6; Creatinine, Ser 0.48; Hemoglobin 13.1; Platelets 251; Potassium 3.7; Sodium 135    Lipid Panel     Component Value Date/Time   CHOL 120 01/12/2020 1011   TRIG 143 01/12/2020 1011   HDL 36 (L) 01/12/2020 1011   CHOLHDL 3.3 01/12/2020 1011   VLDL 29 01/12/2020 1011   LDLCALC 55 01/12/2020 1011     Wt Readings from Last 3 Encounters:  04/14/20 104 lb 3.2 oz (47.3 kg)  02/29/20 102 lb 9.6 oz (46.5 kg)  02/03/20 105 lb 6.4 oz  (47.8 kg)      Other studies Reviewed: Additional studies/ records that were reviewed today include: TTE 01/11/2020 Review of the above records today demonstrates:  1. Left ventricular ejection fraction, by estimation, is 60 to 65%. The  left ventricle has normal function. The left ventricle has no regional  wall motion abnormalities. Left ventricular diastolic parameters are  consistent with Grade I diastolic  dysfunction (impaired relaxation).  2. Right ventricular systolic function is normal. The right ventricular  size is normal.  3. The mitral valve is grossly normal. Trivial mitral valve  regurgitation.  4. The aortic valve is tricuspid. Aortic valve regurgitation is not  visualized.  5. The inferior vena cava is dilated in size with >50% respiratory  variability, suggesting right atrial pressure of 8 mmHg.   Cardiac monitor 03/02/2020 personally reviewed  Sinus bradycardia, normal sinus rhythm and sinus tachycardia. The average heart rate was 86bpm and ranged from 54 to 148bpm.  PVCs and isolated ventricular couplet.  PACs.  Paroxysmal atrial tachycardia with longest episode lasting 2 hours and 51 minutes.  Severe bradycardia with transient complete heart block with ventricular escape up to 6 second pauses during sleep.  ASSESSMENT AND PLAN:  1.  PPJ:KDTOIZ due to an atrial tachycardia based on cardiac monitor.  She was initially diagnosed when she was in the hospital due to ehrlichiosis.  She has felt much improved without notable palpitations.  Most of her episodes occur at night.  At this point, as she is not symptomatic, Hayven Fatima make no further therapy recommendations.  2.  Sick sinus syndrome: Has had significant pauses of up to 6 seconds which are all nocturnal.  She is asymptomatic.  We Brax Walen continue to monitor.  Case discussed with referring cardiologist  Current medicines are reviewed at length with the patient today.   The patient does not have concerns  regarding her medicines.  The following changes were made today:  none  Labs/ tests ordered today include:  Orders Placed This Encounter  Procedures  . EKG 12-Lead     Disposition:   FU with Damyon Mullane 6 months  Signed, Mykaila Blunck Meredith Leeds, MD  04/14/2020 10:31 AM     [facility] [address] [place] [address] [phone] (office) [phone] (fax)

## 2020-04-20 ENCOUNTER — Telehealth (HOSPITAL_COMMUNITY): Payer: Self-pay | Admitting: *Deleted

## 2020-04-20 NOTE — Telephone Encounter (Signed)
Patient given detailed instructions per Myocardial Perfusion Study Information Sheet for the test on 04/25/20 at 10:00. Patient notified to arrive 15 minutes early and that it is imperative to arrive on time for appointment to keep from having the test rescheduled.  If you need to cancel or reschedule your appointment, please call the office within 24 hours of your appointment. . Patient verbalized understanding.Kristina Myers

## 2020-04-25 ENCOUNTER — Other Ambulatory Visit: Payer: Self-pay

## 2020-04-25 ENCOUNTER — Ambulatory Visit (HOSPITAL_COMMUNITY): Payer: Medicare Other | Attending: Cardiology

## 2020-04-25 DIAGNOSIS — R9431 Abnormal electrocardiogram [ECG] [EKG]: Secondary | ICD-10-CM

## 2020-04-25 LAB — MYOCARDIAL PERFUSION IMAGING
LV dias vol: 77 mL (ref 46–106)
LV sys vol: 32 mL
Peak HR: 93 {beats}/min
Rest HR: 56 {beats}/min
SDS: 2
SRS: 2
SSS: 4
TID: 0.9

## 2020-04-25 MED ORDER — REGADENOSON 0.4 MG/5ML IV SOLN
0.4000 mg | Freq: Once | INTRAVENOUS | Status: AC
  Administered 2020-04-25: 0.4 mg via INTRAVENOUS

## 2020-04-25 MED ORDER — TECHNETIUM TC 99M TETROFOSMIN IV KIT
10.6000 | PACK | Freq: Once | INTRAVENOUS | Status: AC | PRN
  Administered 2020-04-25: 10.6 via INTRAVENOUS
  Filled 2020-04-25: qty 11

## 2020-04-25 MED ORDER — TECHNETIUM TC 99M TETROFOSMIN IV KIT
32.5000 | PACK | Freq: Once | INTRAVENOUS | Status: AC | PRN
  Administered 2020-04-25: 32.5 via INTRAVENOUS
  Filled 2020-04-25: qty 33

## 2020-04-26 ENCOUNTER — Encounter (HOSPITAL_COMMUNITY): Payer: Medicare Other

## 2020-04-27 ENCOUNTER — Encounter (HOSPITAL_COMMUNITY): Payer: Medicare Other

## 2020-04-27 DIAGNOSIS — Z961 Presence of intraocular lens: Secondary | ICD-10-CM | POA: Diagnosis not present

## 2020-04-27 DIAGNOSIS — H0102B Squamous blepharitis left eye, upper and lower eyelids: Secondary | ICD-10-CM | POA: Diagnosis not present

## 2020-04-27 DIAGNOSIS — H04123 Dry eye syndrome of bilateral lacrimal glands: Secondary | ICD-10-CM | POA: Diagnosis not present

## 2020-04-27 DIAGNOSIS — H2511 Age-related nuclear cataract, right eye: Secondary | ICD-10-CM | POA: Diagnosis not present

## 2020-04-27 DIAGNOSIS — H26492 Other secondary cataract, left eye: Secondary | ICD-10-CM | POA: Diagnosis not present

## 2020-04-27 DIAGNOSIS — H0102A Squamous blepharitis right eye, upper and lower eyelids: Secondary | ICD-10-CM | POA: Diagnosis not present

## 2020-04-28 ENCOUNTER — Ambulatory Visit (INDEPENDENT_AMBULATORY_CARE_PROVIDER_SITE_OTHER): Payer: Medicare Other

## 2020-04-28 ENCOUNTER — Encounter: Payer: Self-pay | Admitting: Pulmonary Disease

## 2020-04-28 ENCOUNTER — Ambulatory Visit (INDEPENDENT_AMBULATORY_CARE_PROVIDER_SITE_OTHER): Payer: Medicare Other | Admitting: Pulmonary Disease

## 2020-04-28 ENCOUNTER — Other Ambulatory Visit: Payer: Self-pay

## 2020-04-28 VITALS — BP 114/70 | HR 85 | Temp 96.8°F | Ht 61.5 in | Wt 104.8 lb

## 2020-04-28 DIAGNOSIS — R918 Other nonspecific abnormal finding of lung field: Secondary | ICD-10-CM

## 2020-04-28 DIAGNOSIS — J189 Pneumonia, unspecified organism: Secondary | ICD-10-CM | POA: Diagnosis not present

## 2020-04-28 NOTE — Progress Notes (Signed)
   Subjective:    Patient ID: Kristina Myers, female    DOB: July 01, 1946, 74 y.o.   MRN: [id number]  HPI  74 year old never smoker initially consulted  01/11/2020 for hospitalization status post a tick bite with acute respiratory failure and groundglass opacity on the CT , hyponatremia, elevated LFTs/cholestasis, leukopenia and thrombocytopenia  Ehrlichia PCR was positive.  She was treated with doxycycline with good clinical response, weaned from 15 L oxygen to room air  Follow-up chest x-ray as an outpatient 7/26 independently reviewed showed decreased pulmonary opacities with mild persistence in the right apex consistent with resolving pneumonia   She feels well otherwise, no dyspnea, no palpitations or skin rash or joint issues  Review of Systems Patient denies significant dyspnea,cough, hemoptysis,  chest pain, palpitations, pedal edema, orthopnea, paroxysmal nocturnal dyspnea, lightheadedness, nausea, vomiting, abdominal or  leg pains      Objective:   Physical Exam  Gen. Pleasant, well-nourished, in no distress ENT - no thrush, no pallor/icterus,no post nasal drip Neck: No JVD, no thyromegaly, no carotid bruits Lungs: no use of accessory muscles, no dullness to percussion, clear without rales or rhonchi  Cardiovascular: Rhythm regular, heart sounds  normal, no murmurs or gallops, no peripheral edema Musculoskeletal: No deformities, no cyanosis or clubbing        Assessment & Plan:

## 2020-04-28 NOTE — Patient Instructions (Signed)
Chest xray today.

## 2020-04-28 NOTE — Assessment & Plan Note (Signed)
Due to ehrlichia from tick bite, now resolved Chest x-ray independently reviewed which does not show any infiltrates. Right apical infiltrate likely due to crossing of collarbone and first rib No follow-up required

## 2020-05-31 DIAGNOSIS — Z1389 Encounter for screening for other disorder: Secondary | ICD-10-CM | POA: Diagnosis not present

## 2020-05-31 DIAGNOSIS — C4490 Unspecified malignant neoplasm of skin, unspecified: Secondary | ICD-10-CM | POA: Diagnosis not present

## 2020-05-31 DIAGNOSIS — Z23 Encounter for immunization: Secondary | ICD-10-CM | POA: Diagnosis not present

## 2020-05-31 DIAGNOSIS — M810 Age-related osteoporosis without current pathological fracture: Secondary | ICD-10-CM | POA: Diagnosis not present

## 2020-05-31 DIAGNOSIS — R0789 Other chest pain: Secondary | ICD-10-CM | POA: Diagnosis not present

## 2020-05-31 DIAGNOSIS — J80 Acute respiratory distress syndrome: Secondary | ICD-10-CM | POA: Diagnosis not present

## 2020-05-31 DIAGNOSIS — Z0000 Encounter for general adult medical examination without abnormal findings: Secondary | ICD-10-CM | POA: Diagnosis not present

## 2020-05-31 DIAGNOSIS — Z8041 Family history of malignant neoplasm of ovary: Secondary | ICD-10-CM | POA: Diagnosis not present

## 2020-05-31 DIAGNOSIS — Z803 Family history of malignant neoplasm of breast: Secondary | ICD-10-CM | POA: Diagnosis not present

## 2020-06-07 DIAGNOSIS — Z23 Encounter for immunization: Secondary | ICD-10-CM | POA: Diagnosis not present

## 2020-06-13 ENCOUNTER — Other Ambulatory Visit: Payer: Self-pay | Admitting: Family Medicine

## 2020-06-13 DIAGNOSIS — M810 Age-related osteoporosis without current pathological fracture: Secondary | ICD-10-CM

## 2020-10-25 DIAGNOSIS — Z880 Allergy status to penicillin: Secondary | ICD-10-CM | POA: Diagnosis not present

## 2020-10-25 DIAGNOSIS — M8580 Other specified disorders of bone density and structure, unspecified site: Secondary | ICD-10-CM | POA: Diagnosis not present

## 2020-10-25 DIAGNOSIS — H6123 Impacted cerumen, bilateral: Secondary | ICD-10-CM | POA: Diagnosis not present

## 2020-11-06 DIAGNOSIS — Z23 Encounter for immunization: Secondary | ICD-10-CM | POA: Diagnosis not present

## 2020-12-07 ENCOUNTER — Other Ambulatory Visit: Payer: No Typology Code available for payment source

## 2021-01-26 DIAGNOSIS — C44319 Basal cell carcinoma of skin of other parts of face: Secondary | ICD-10-CM | POA: Diagnosis not present

## 2021-01-26 DIAGNOSIS — C4401 Basal cell carcinoma of skin of lip: Secondary | ICD-10-CM | POA: Diagnosis not present

## 2021-01-26 DIAGNOSIS — L820 Inflamed seborrheic keratosis: Secondary | ICD-10-CM | POA: Diagnosis not present

## 2021-01-26 DIAGNOSIS — L570 Actinic keratosis: Secondary | ICD-10-CM | POA: Diagnosis not present

## 2021-01-26 DIAGNOSIS — L812 Freckles: Secondary | ICD-10-CM | POA: Diagnosis not present

## 2021-01-26 DIAGNOSIS — D0471 Carcinoma in situ of skin of right lower limb, including hip: Secondary | ICD-10-CM | POA: Diagnosis not present

## 2021-01-26 DIAGNOSIS — L821 Other seborrheic keratosis: Secondary | ICD-10-CM | POA: Diagnosis not present

## 2021-01-26 DIAGNOSIS — D485 Neoplasm of uncertain behavior of skin: Secondary | ICD-10-CM | POA: Diagnosis not present

## 2021-02-14 ENCOUNTER — Other Ambulatory Visit: Payer: Self-pay | Admitting: Family Medicine

## 2021-02-14 DIAGNOSIS — Z1231 Encounter for screening mammogram for malignant neoplasm of breast: Secondary | ICD-10-CM

## 2021-03-16 DIAGNOSIS — C4401 Basal cell carcinoma of skin of lip: Secondary | ICD-10-CM | POA: Diagnosis not present

## 2021-03-16 DIAGNOSIS — Z85828 Personal history of other malignant neoplasm of skin: Secondary | ICD-10-CM | POA: Diagnosis not present

## 2021-04-19 ENCOUNTER — Ambulatory Visit
Admission: RE | Admit: 2021-04-19 | Discharge: 2021-04-19 | Disposition: A | Discharge status: Home / Self Care | Payer: Medicare Other | Source: Ambulatory Visit

## 2021-04-19 ENCOUNTER — Other Ambulatory Visit: Payer: Self-pay

## 2021-04-19 DIAGNOSIS — Z1231 Encounter for screening mammogram for malignant neoplasm of breast: Secondary | ICD-10-CM

## 2021-04-26 ENCOUNTER — Other Ambulatory Visit: Payer: Self-pay

## 2021-04-26 ENCOUNTER — Ambulatory Visit
Admission: RE | Admit: 2021-04-26 | Discharge: 2021-04-26 | Disposition: A | Discharge status: Home / Self Care | Payer: Medicare Other | Source: Ambulatory Visit | Attending: Family Medicine | Admitting: Family Medicine

## 2021-04-26 DIAGNOSIS — Z23 Encounter for immunization: Secondary | ICD-10-CM | POA: Diagnosis not present

## 2021-04-26 DIAGNOSIS — Z780 Asymptomatic menopausal state: Secondary | ICD-10-CM | POA: Diagnosis not present

## 2021-04-26 DIAGNOSIS — M8589 Other specified disorders of bone density and structure, multiple sites: Secondary | ICD-10-CM | POA: Diagnosis not present

## 2021-04-26 DIAGNOSIS — M810 Age-related osteoporosis without current pathological fracture: Secondary | ICD-10-CM

## 2021-05-03 DIAGNOSIS — H0102B Squamous blepharitis left eye, upper and lower eyelids: Secondary | ICD-10-CM | POA: Diagnosis not present

## 2021-05-03 DIAGNOSIS — H26492 Other secondary cataract, left eye: Secondary | ICD-10-CM | POA: Diagnosis not present

## 2021-05-03 DIAGNOSIS — H04123 Dry eye syndrome of bilateral lacrimal glands: Secondary | ICD-10-CM | POA: Diagnosis not present

## 2021-05-03 DIAGNOSIS — Z961 Presence of intraocular lens: Secondary | ICD-10-CM | POA: Diagnosis not present

## 2021-05-03 DIAGNOSIS — H2511 Age-related nuclear cataract, right eye: Secondary | ICD-10-CM | POA: Diagnosis not present

## 2021-05-03 DIAGNOSIS — H0102A Squamous blepharitis right eye, upper and lower eyelids: Secondary | ICD-10-CM | POA: Diagnosis not present

## 2021-05-18 IMAGING — DX DG CHEST 1V
1 series · 1 of 1 positions shown · non-contrast
Comparison: CT and radiograph 01/11/2020

CLINICAL DATA: Pneumonia, increasing work of breathing

EXAM:
CHEST  1 VIEW

[chest]
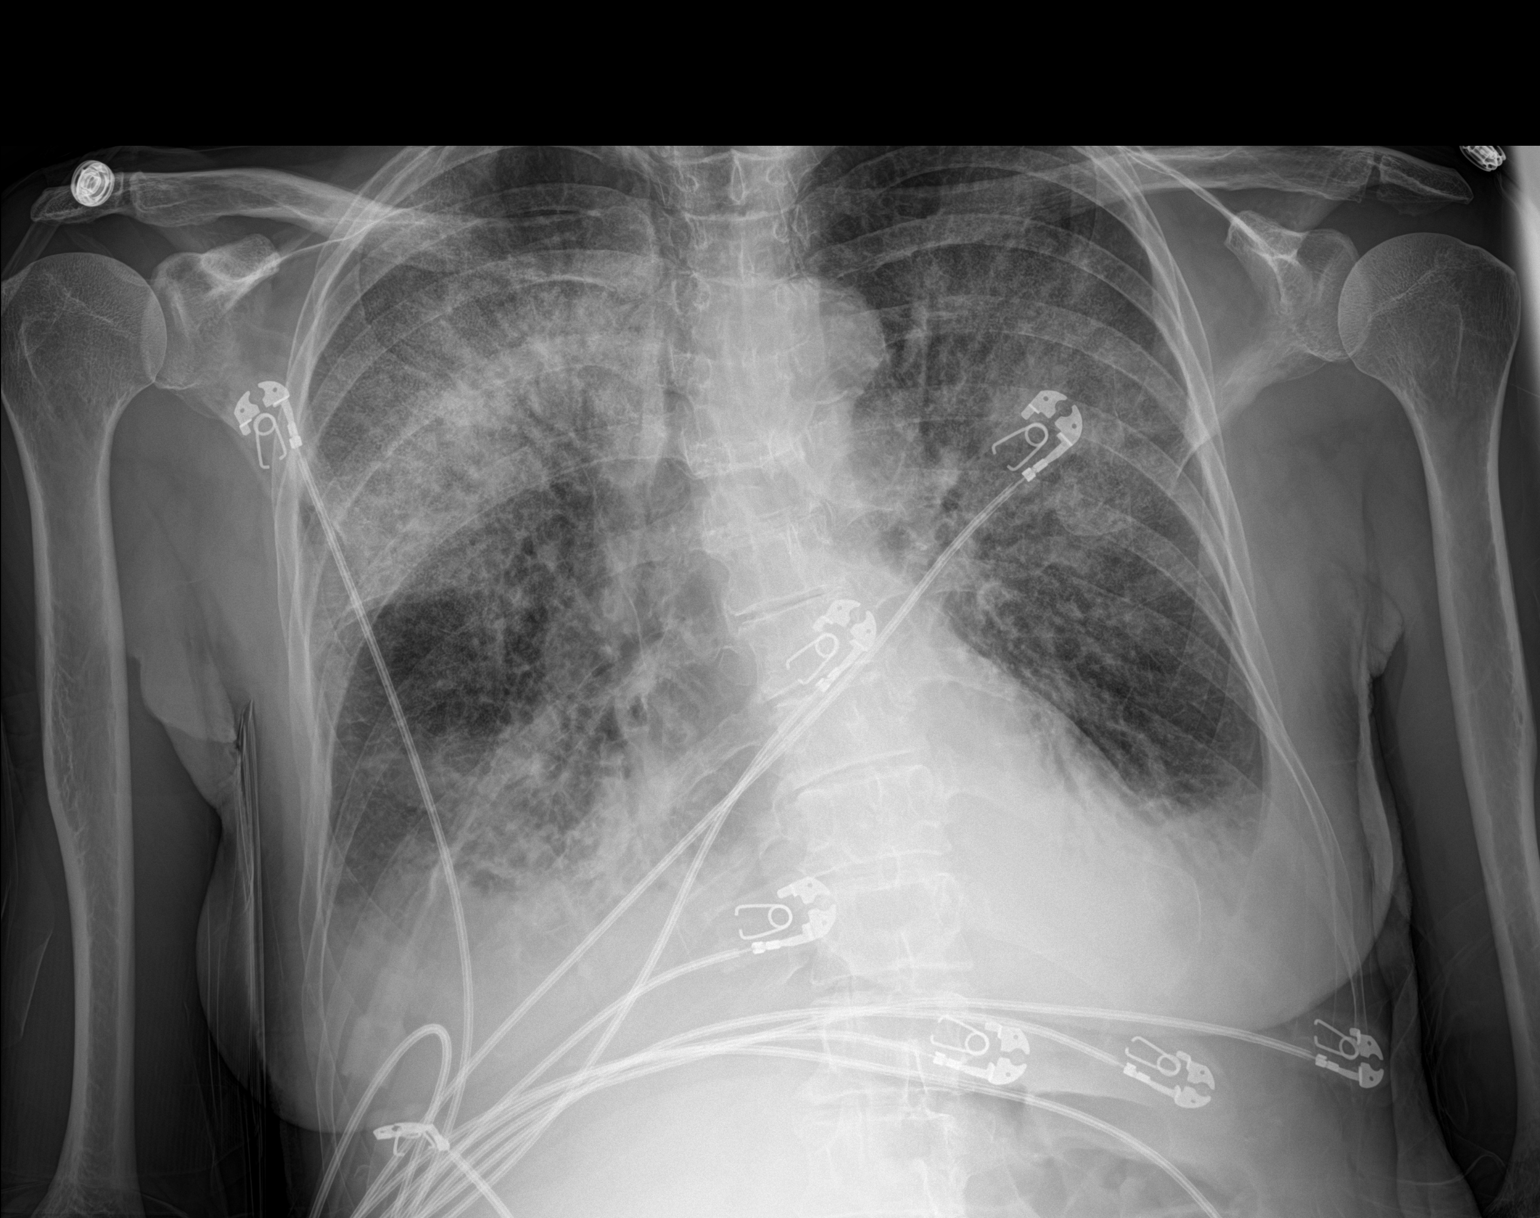

[1 of 1 positions shown; findings below may reference images not displayed]

FINDINGS: Increasing airspace opacity in the mid to upper lungs and right lung
base. Bilateral pleural effusions are again seen. Background of
coarse reticular interstitial opacity throughout both lungs.
Cardiomediastinal contours are stable. The aorta is calcified. The
remaining cardiomediastinal contours are unremarkable. No acute
osseous or soft tissue abnormality. Degenerative changes are present
in the imaged spine and shoulders. Telemetry leads overlie the
chest.
IMPRESSION: Increasing airspace opacity in the mid to upper lungs and right lung
base which could reflect edema and/or infection. Similar
distribution to the recent CT.

Bilateral effusions.

Aortic Atherosclerosis ([id number]).

## 2021-05-19 IMAGING — DX DG CHEST 1V PORT
1 series · 1 of 1 positions shown · non-contrast
Comparison: January 12, 2020

CLINICAL DATA: Respiratory distress

EXAM:
PORTABLE CHEST 1 VIEW

[chest]
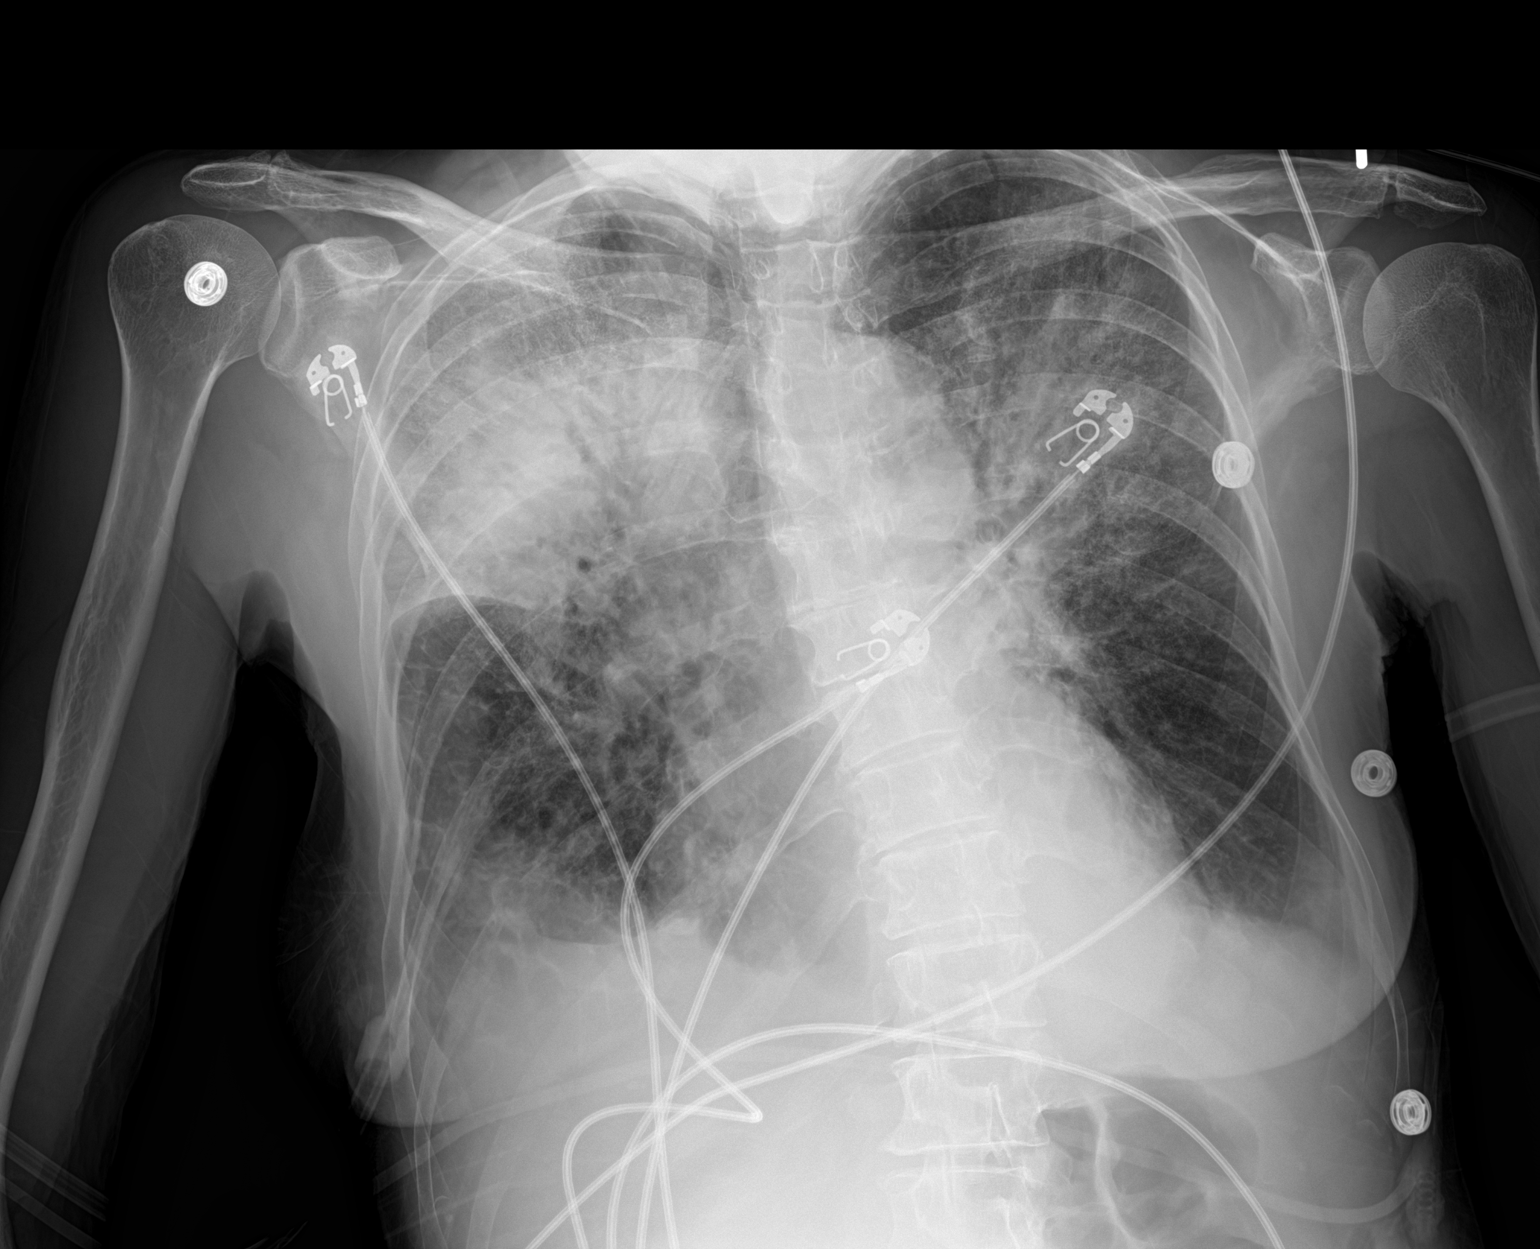

[1 of 1 positions shown; findings below may reference images not displayed]

FINDINGS: There is progression of consolidation in the right upper lobe
compared to 1 day prior. There is stable airspace opacity throughout
much of the left upper lobe as well as in the right base. There are
pleural effusions bilaterally, similar to 1 day prior. Heart size
normal. No adenopathy evident. Pulmonary vascularity within normal
limits. No bone lesions.
IMPRESSION: Multifocal airspace opacity with increase in consolidation in the
right upper lobe. Other areas of airspace opacity appear stable
compared to 1 day prior. Small pleural effusions bilaterally. Stable
cardiac silhouette.

## 2021-05-20 IMAGING — DX DG CHEST 1V PORT
1 series · 1 of 1 positions shown · non-contrast
Comparison: Single-view of the chest 01/13/2020 and 01/12/2020. PA
and lateral chest 01/11/2020. CT chest 01/11/2020.

CLINICAL DATA: Acute respiratory failure.  Hypoxia.

EXAM:
PORTABLE CHEST 1 VIEW

[chest ap]
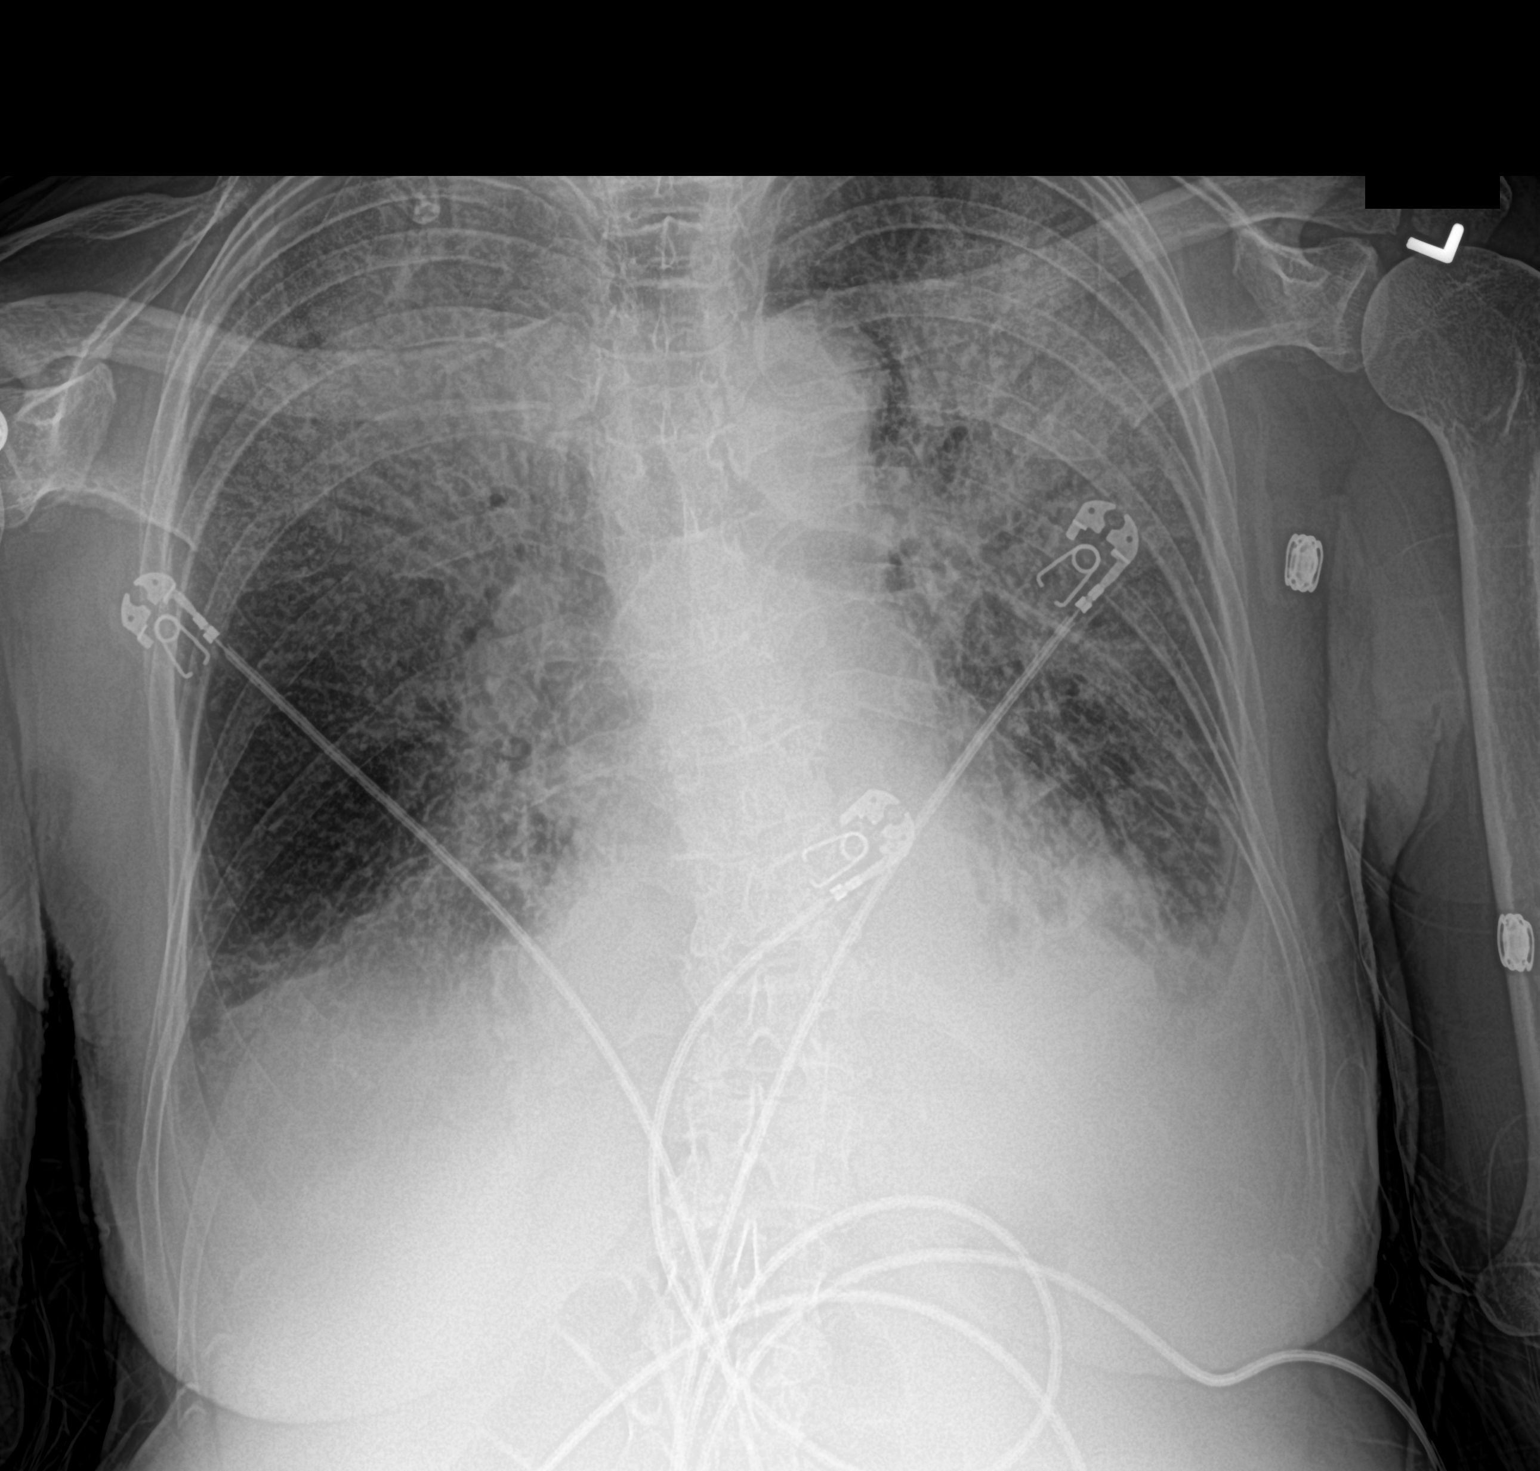

[1 of 1 positions shown; findings below may reference images not displayed]

FINDINGS: Extensive bilateral airspace disease persists. Aeration in the right
upper and lower lung zones has improved but aeration in the left
lung base has worsened. Small bilateral pleural effusions are seen.
No pneumothorax. Heart size is normal.
IMPRESSION: Multifocal pneumonia. There has been some improved aeration in the
right chest but airspace disease in the left lung base is worse than
on yesterday's exam.

## 2021-05-21 IMAGING — DX DG CHEST 1V PORT
1 series · 1 of 1 positions shown · non-contrast
Comparison: Chest x-ray 01/14/2020.

CLINICAL DATA: 73-year-old female with history of pulmonary
infiltrates.

EXAM:
PORTABLE CHEST 1 VIEW

[chest]
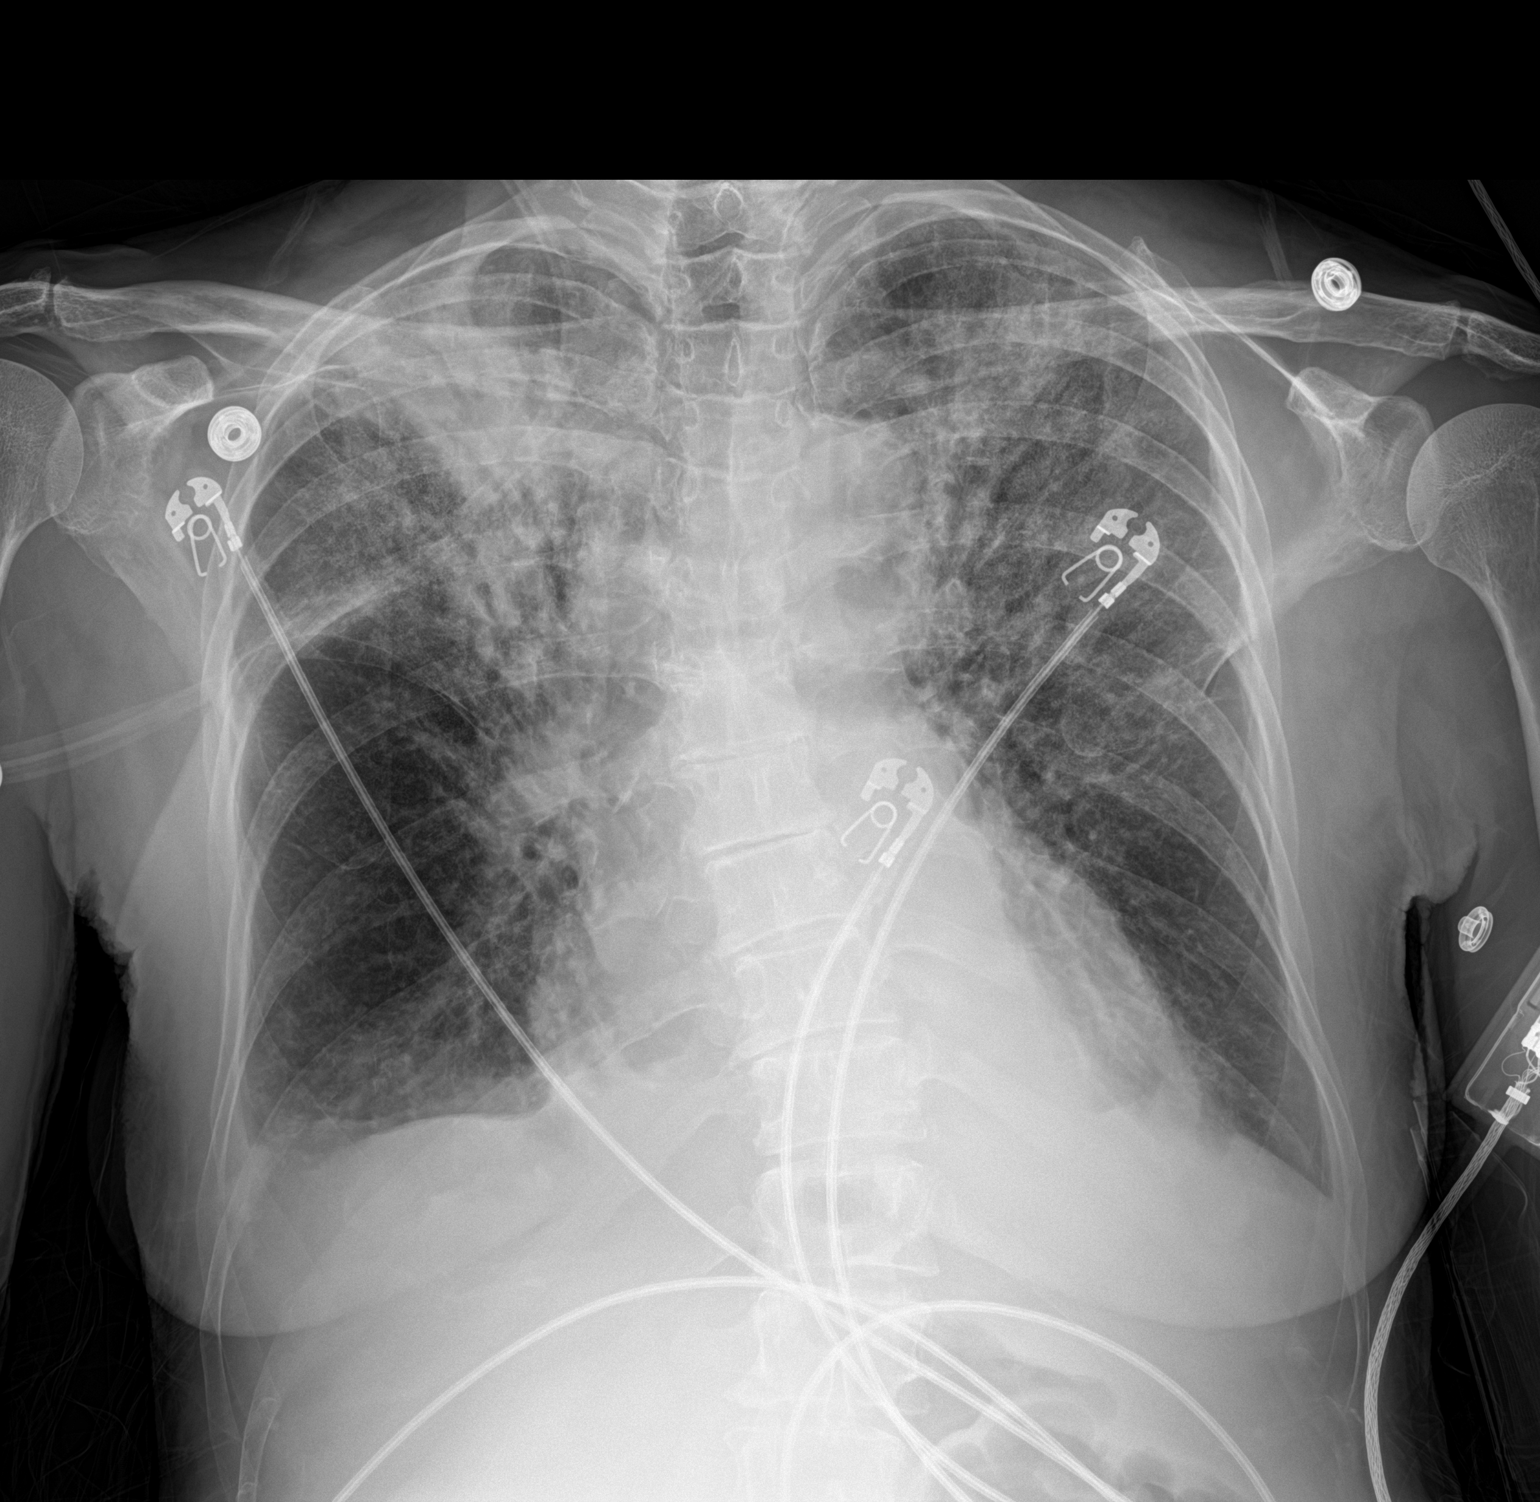

[1 of 1 positions shown; findings below may reference images not displayed]

FINDINGS: Extensive multifocal airspace consolidation, most evident in the
upper lobes of the lungs bilaterally (right greater than left) where
there is also widespread air bronchograms. Elevation of the minor
fissure indicative of worsening right upper lobe volume loss. Medial
basilar opacities bilaterally may reflect additional areas of
atelectasis and/or consolidation. Small bilateral pleural effusions.
No evidence of pulmonary edema. Heart size is mildly enlarged.
Aortic atherosclerosis.
IMPRESSION: 1. Multilobar bilateral pneumonia, with more confluent consolidation
and atelectasis in the right upper lobe than seen on the prior
study.
2. Small bilateral pleural effusions have decreased slightly.
3. Aortic atherosclerosis.
4. Mild cardiomegaly.

## 2021-05-24 DIAGNOSIS — Z23 Encounter for immunization: Secondary | ICD-10-CM | POA: Diagnosis not present

## 2021-07-24 DIAGNOSIS — L570 Actinic keratosis: Secondary | ICD-10-CM | POA: Diagnosis not present

## 2021-07-24 DIAGNOSIS — Z85828 Personal history of other malignant neoplasm of skin: Secondary | ICD-10-CM | POA: Diagnosis not present

## 2021-07-28 DIAGNOSIS — Z1389 Encounter for screening for other disorder: Secondary | ICD-10-CM | POA: Diagnosis not present

## 2021-07-28 DIAGNOSIS — K7689 Other specified diseases of liver: Secondary | ICD-10-CM | POA: Diagnosis not present

## 2021-07-28 DIAGNOSIS — C4490 Unspecified malignant neoplasm of skin, unspecified: Secondary | ICD-10-CM | POA: Diagnosis not present

## 2021-07-28 DIAGNOSIS — Z803 Family history of malignant neoplasm of breast: Secondary | ICD-10-CM | POA: Diagnosis not present

## 2021-07-28 DIAGNOSIS — Z8041 Family history of malignant neoplasm of ovary: Secondary | ICD-10-CM | POA: Diagnosis not present

## 2021-07-28 DIAGNOSIS — M810 Age-related osteoporosis without current pathological fracture: Secondary | ICD-10-CM | POA: Diagnosis not present

## 2021-07-28 DIAGNOSIS — Z0000 Encounter for general adult medical examination without abnormal findings: Secondary | ICD-10-CM | POA: Diagnosis not present

## 2021-07-28 DIAGNOSIS — R002 Palpitations: Secondary | ICD-10-CM | POA: Diagnosis not present

## 2021-09-02 IMAGING — DX DG CHEST 2V
2 series · 2 of 2 positions shown · non-contrast
Comparison: February 29, 2020

CLINICAL DATA: Follow-up pneumonia

EXAM:
CHEST - 2 VIEW

[chest pa]
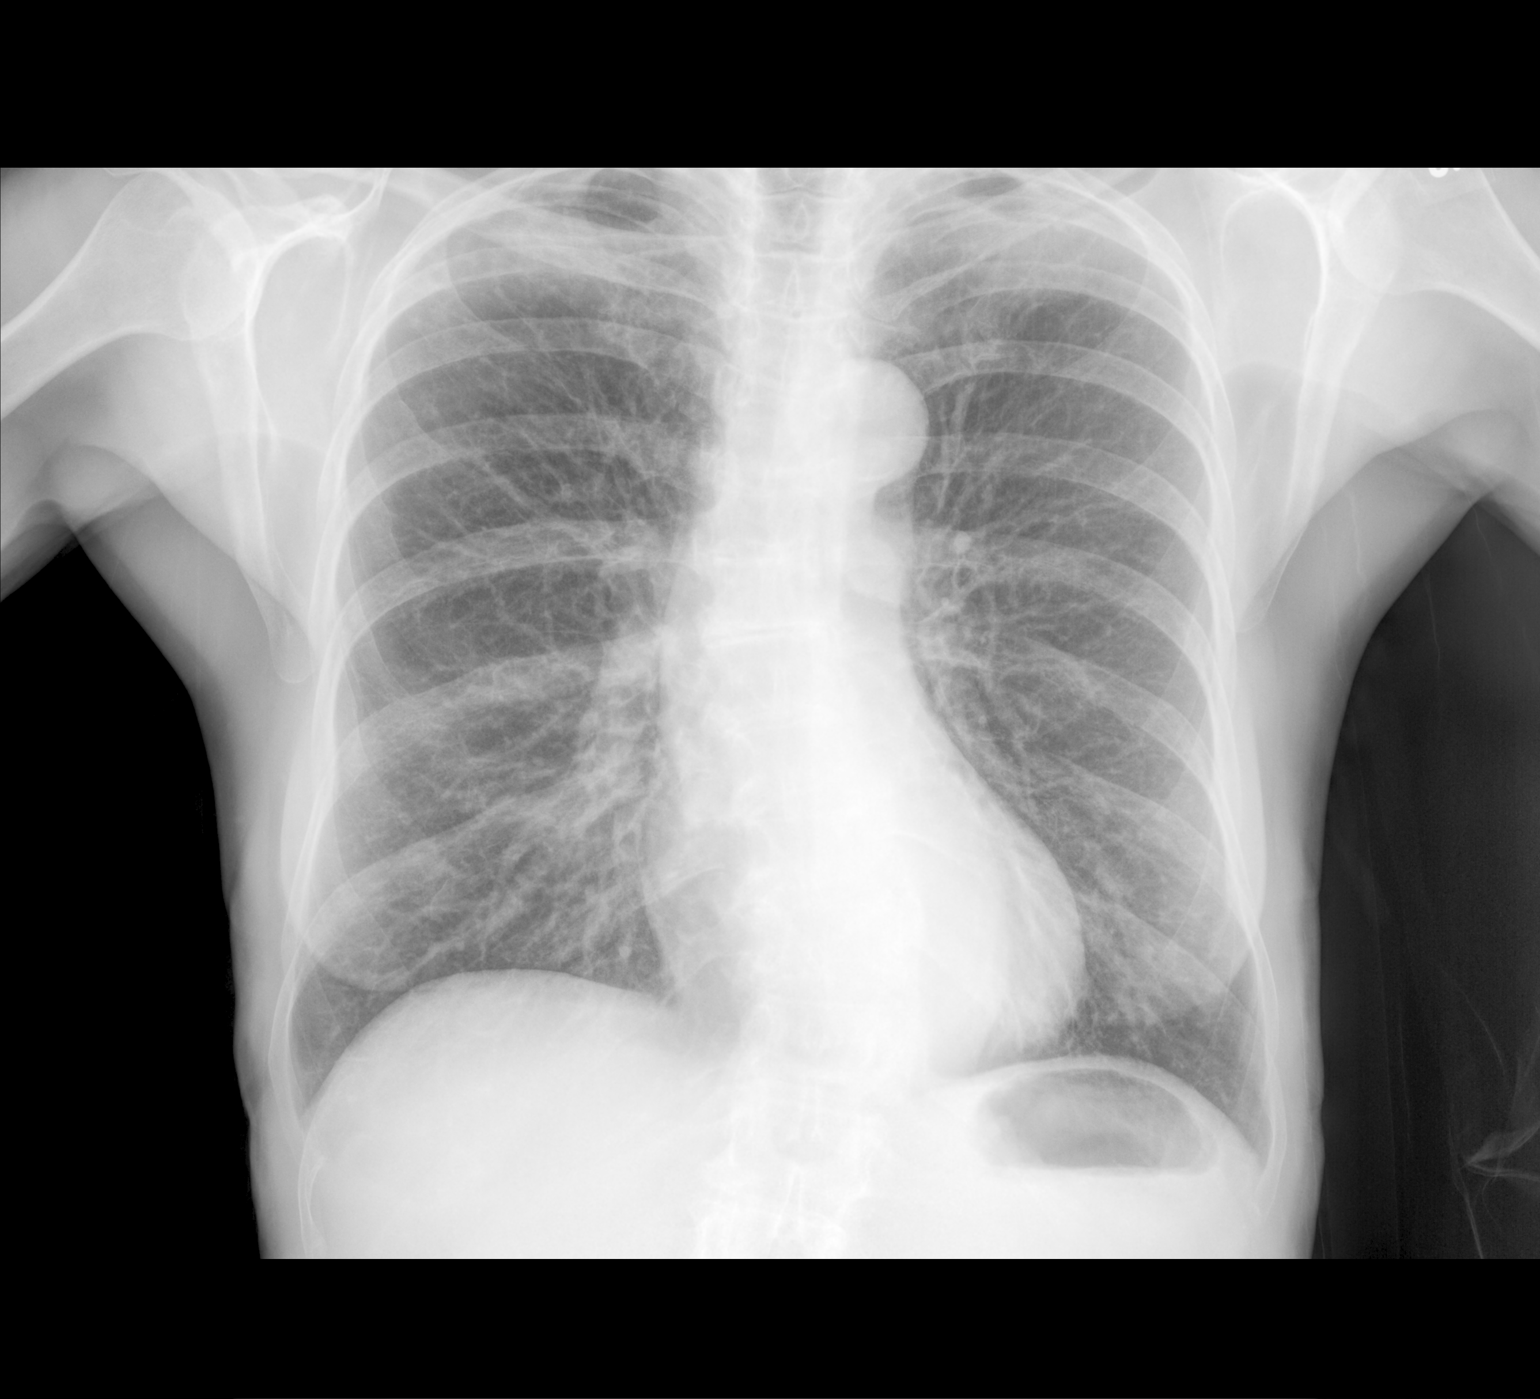

[chest lat]
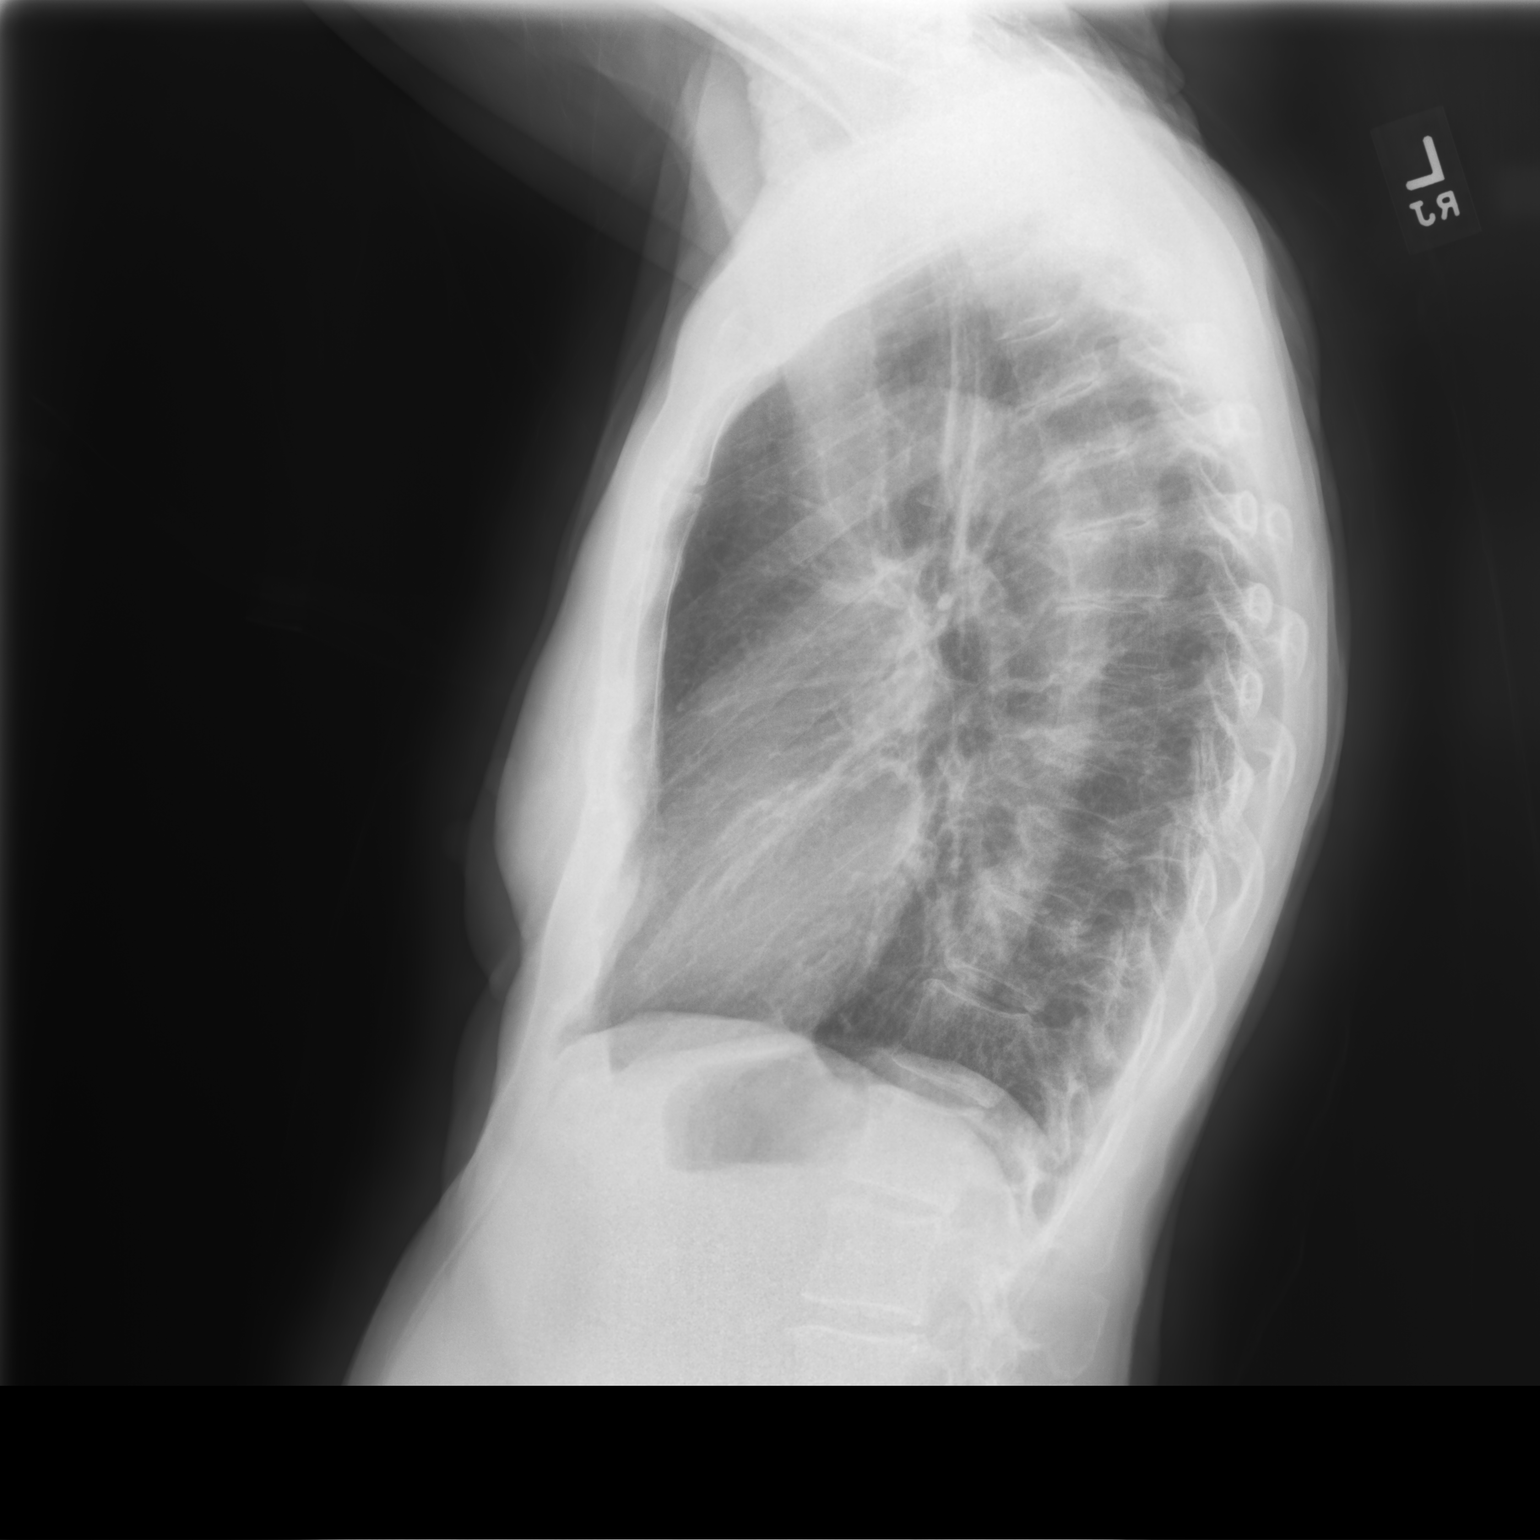

[2 of 2 positions shown; findings below may reference images not displayed]

FINDINGS: The heart size and mediastinal contours are within normal limits.
Both lungs are clear. The visualized skeletal structures are stable.
IMPRESSION: No active cardiopulmonary disease.

## 2021-09-22 ENCOUNTER — Encounter: Payer: Self-pay | Admitting: Cardiology

## 2022-01-31 DIAGNOSIS — L812 Freckles: Secondary | ICD-10-CM | POA: Diagnosis not present

## 2022-01-31 DIAGNOSIS — L570 Actinic keratosis: Secondary | ICD-10-CM | POA: Diagnosis not present

## 2022-01-31 DIAGNOSIS — L720 Epidermal cyst: Secondary | ICD-10-CM | POA: Diagnosis not present

## 2022-01-31 DIAGNOSIS — D1801 Hemangioma of skin and subcutaneous tissue: Secondary | ICD-10-CM | POA: Diagnosis not present

## 2022-01-31 DIAGNOSIS — D485 Neoplasm of uncertain behavior of skin: Secondary | ICD-10-CM | POA: Diagnosis not present

## 2022-01-31 DIAGNOSIS — L821 Other seborrheic keratosis: Secondary | ICD-10-CM | POA: Diagnosis not present

## 2022-01-31 DIAGNOSIS — Z85828 Personal history of other malignant neoplasm of skin: Secondary | ICD-10-CM | POA: Diagnosis not present

## 2022-04-04 DIAGNOSIS — C44719 Basal cell carcinoma of skin of left lower limb, including hip: Secondary | ICD-10-CM | POA: Diagnosis not present

## 2022-04-04 DIAGNOSIS — L570 Actinic keratosis: Secondary | ICD-10-CM | POA: Diagnosis not present

## 2022-04-04 DIAGNOSIS — D485 Neoplasm of uncertain behavior of skin: Secondary | ICD-10-CM | POA: Diagnosis not present

## 2022-04-04 DIAGNOSIS — Z1231 Encounter for screening mammogram for malignant neoplasm of breast: Secondary | ICD-10-CM

## 2022-04-04 DIAGNOSIS — Z85828 Personal history of other malignant neoplasm of skin: Secondary | ICD-10-CM | POA: Diagnosis not present

## 2022-04-12 DIAGNOSIS — Z23 Encounter for immunization: Secondary | ICD-10-CM | POA: Diagnosis not present

## 2022-05-22 ENCOUNTER — Other Ambulatory Visit: Payer: Self-pay | Admitting: Family Medicine

## 2022-05-22 DIAGNOSIS — Z1231 Encounter for screening mammogram for malignant neoplasm of breast: Secondary | ICD-10-CM

## 2022-05-29 ENCOUNTER — Ambulatory Visit
Admission: RE | Admit: 2022-05-29 | Discharge: 2022-05-29 | Disposition: A | Discharge status: Home / Self Care | Payer: Medicare Other | Source: Ambulatory Visit | Attending: Family Medicine | Admitting: Family Medicine

## 2022-05-29 DIAGNOSIS — Z1231 Encounter for screening mammogram for malignant neoplasm of breast: Secondary | ICD-10-CM

## 2022-05-31 DIAGNOSIS — H04123 Dry eye syndrome of bilateral lacrimal glands: Secondary | ICD-10-CM | POA: Diagnosis not present

## 2022-05-31 DIAGNOSIS — H0102B Squamous blepharitis left eye, upper and lower eyelids: Secondary | ICD-10-CM | POA: Diagnosis not present

## 2022-05-31 DIAGNOSIS — Z961 Presence of intraocular lens: Secondary | ICD-10-CM | POA: Diagnosis not present

## 2022-05-31 DIAGNOSIS — H2511 Age-related nuclear cataract, right eye: Secondary | ICD-10-CM | POA: Diagnosis not present

## 2022-05-31 DIAGNOSIS — H0102A Squamous blepharitis right eye, upper and lower eyelids: Secondary | ICD-10-CM | POA: Diagnosis not present

## 2022-05-31 DIAGNOSIS — H26492 Other secondary cataract, left eye: Secondary | ICD-10-CM | POA: Diagnosis not present

## 2023-05-17 ENCOUNTER — Telehealth: Payer: Self-pay | Admitting: Cardiology

## 2023-05-17 NOTE — Telephone Encounter (Signed)
Pt's husband is trying to get an appointment for the pt because they just came back from [place] where the pt had an episode. Please advise

## 2023-05-20 NOTE — Telephone Encounter (Signed)
Called patient back regarding scheduling an appointment. No answer on either phone number listed in chart/DPR. Left message with no identifiers asking recipient to call our office #.

## 2023-05-21 ENCOUNTER — Ambulatory Visit (HOSPITAL_COMMUNITY)
Admission: RE | Admit: 2023-05-21 | Discharge: 2023-05-21 | Disposition: A | Discharge status: Home / Self Care | Payer: Medicare Other | Source: Ambulatory Visit | Attending: Neurosurgery | Admitting: Neurosurgery

## 2023-05-21 ENCOUNTER — Other Ambulatory Visit (HOSPITAL_COMMUNITY): Payer: Self-pay | Admitting: Neurosurgery

## 2023-05-21 DIAGNOSIS — S065XAA Traumatic subdural hemorrhage with loss of consciousness status unknown, initial encounter: Secondary | ICD-10-CM | POA: Insufficient documentation

## 2023-06-06 NOTE — Telephone Encounter (Signed)
Call to patient to discuss her symptoms. Unable to reach patient by phone, spoke with spouse Kristina Myers who is listed on DPR. Kristina Myers states that patient was treated for small brain bleed by her neurologist at [facility]. Patient is currently wearing a heart monitor ordered by her neurologist. Offered appt for patient, spouse states they are staying in [place] until [place] and will not be back to [place]. Advised that they might consider making an appt in a few weeks as books for mid-may are not yet open in case heart monitor shows afib that needs to be treated. Patient's spouse insists they are not coming back to [place] before May and they may seek treatment in [place]. Advised that since patient thad not been seen here since 2021 she would need a new patient appt so he might want to consider making an appt sooner to re-establish care. Spouse states they will discuss and call our office if needed.

## 2023-06-12 NOTE — Telephone Encounter (Signed)
Patient returned RN Erica's call.

## 2023-06-12 NOTE — Telephone Encounter (Signed)
Returned patient's call. No answer, left message per DPR asking recipient to call our office.

## 2023-06-13 NOTE — Telephone Encounter (Signed)
Patient states her PCP ordered a heart monitor for her. Once she gets results from her PCP she will contact us if she wants to get back in w/ Dr. Mayford Knife.

## 2024-01-09 ENCOUNTER — Other Ambulatory Visit (HOSPITAL_BASED_OUTPATIENT_CLINIC_OR_DEPARTMENT_OTHER): Payer: Self-pay | Admitting: Internal Medicine

## 2024-01-09 DIAGNOSIS — M8588 Other specified disorders of bone density and structure, other site: Secondary | ICD-10-CM

## 2024-04-23 ENCOUNTER — Ambulatory Visit (HOSPITAL_BASED_OUTPATIENT_CLINIC_OR_DEPARTMENT_OTHER)
Admission: RE | Admit: 2024-04-23 | Discharge: 2024-04-23 | Disposition: A | Discharge status: Home / Self Care | Source: Ambulatory Visit | Attending: Internal Medicine | Admitting: Internal Medicine

## 2024-04-23 DIAGNOSIS — M8588 Other specified disorders of bone density and structure, other site: Secondary | ICD-10-CM | POA: Insufficient documentation
